# Patient Record
Sex: Female | Born: 1995 | Race: Black or African American | Hispanic: No | Marital: Single | Smoking: Current every day smoker
Health system: Southern US, Community
[De-identification: ages and names within clinical notes are randomized; demographics above are authoritative.]

## PROBLEM LIST (undated history)

## (undated) ENCOUNTER — Ambulatory Visit (HOSPITAL_COMMUNITY): Admission: EM | Payer: BLUE CROSS/BLUE SHIELD | Source: Home / Self Care

## (undated) ENCOUNTER — Ambulatory Visit (HOSPITAL_COMMUNITY): Admission: EM | Payer: Medicaid Other | Source: Home / Self Care

## (undated) DIAGNOSIS — Z8619 Personal history of other infectious and parasitic diseases: Secondary | ICD-10-CM

## (undated) HISTORY — DX: Personal history of other infectious and parasitic diseases: Z86.19

---
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
## 2015-04-14 ENCOUNTER — Emergency Department (HOSPITAL_COMMUNITY)
Admission: EM | Admit: 2015-04-14 | Discharge: 2015-04-14 | Disposition: A | Discharge status: Home / Self Care | Payer: No Typology Code available for payment source | Attending: Emergency Medicine | Admitting: Emergency Medicine

## 2015-04-14 ENCOUNTER — Encounter (HOSPITAL_COMMUNITY): Payer: Self-pay | Admitting: *Deleted

## 2015-04-14 DIAGNOSIS — S79921A Unspecified injury of right thigh, initial encounter: Secondary | ICD-10-CM | POA: Insufficient documentation

## 2015-04-14 DIAGNOSIS — Z79899 Other long term (current) drug therapy: Secondary | ICD-10-CM | POA: Diagnosis not present

## 2015-04-14 DIAGNOSIS — Z7951 Long term (current) use of inhaled steroids: Secondary | ICD-10-CM | POA: Diagnosis not present

## 2015-04-14 DIAGNOSIS — Y9389 Activity, other specified: Secondary | ICD-10-CM | POA: Diagnosis not present

## 2015-04-14 DIAGNOSIS — Y999 Unspecified external cause status: Secondary | ICD-10-CM | POA: Insufficient documentation

## 2015-04-14 DIAGNOSIS — Z8619 Personal history of other infectious and parasitic diseases: Secondary | ICD-10-CM | POA: Diagnosis not present

## 2015-04-14 DIAGNOSIS — S3992XA Unspecified injury of lower back, initial encounter: Secondary | ICD-10-CM | POA: Diagnosis not present

## 2015-04-14 DIAGNOSIS — M79604 Pain in right leg: Secondary | ICD-10-CM

## 2015-04-14 DIAGNOSIS — Z793 Long term (current) use of hormonal contraceptives: Secondary | ICD-10-CM | POA: Insufficient documentation

## 2015-04-14 DIAGNOSIS — Z7952 Long term (current) use of systemic steroids: Secondary | ICD-10-CM | POA: Diagnosis not present

## 2015-04-14 DIAGNOSIS — Z720 Tobacco use: Secondary | ICD-10-CM | POA: Diagnosis not present

## 2015-04-14 DIAGNOSIS — Y92481 Parking lot as the place of occurrence of the external cause: Secondary | ICD-10-CM | POA: Insufficient documentation

## 2015-04-14 DIAGNOSIS — IMO0002 Reserved for concepts with insufficient information to code with codable children: Secondary | ICD-10-CM

## 2015-04-14 DIAGNOSIS — M545 Low back pain, unspecified: Secondary | ICD-10-CM

## 2015-04-14 MED ORDER — IBUPROFEN 600 MG PO TABS: 600.0000 mg | ORAL_TABLET | Freq: Four times a day (QID) | ORAL | Status: DC | PRN
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
## 2015-04-14 NOTE — ED Notes (Signed)
Declined W/C at D/C and was escorted to lobby by RN. 

## 2015-04-14 NOTE — Discharge Instructions (Signed)
Read the information below.  Use the prescribed medication as directed.  Please discuss all new medications with your pharmacist.  You may return to the Emergency Department at any time for worsening condition or any new symptoms that concern you.    If there is any possibility that you might be pregnant, please let your health care provider know and discuss this with the pharmacist to ensure medication safety.     If you develop fevers, loss of control of bowel or bladder, weakness or numbness in your legs, or are unable to walk, return to the ER for a recheck.   Back Pain, Adult Back pain is very common. The pain often gets better over time. The cause of back pain is usually not dangerous. Most people can learn to manage their back pain on their own.  HOME CARE   Stay active. Start with short walks on flat ground if you can. Try to walk farther each day.  Do not sit, drive, or stand in one place for more than 30 minutes. Do not stay in bed.  Do not avoid exercise or work. Activity can help your back heal faster.  Be careful when you bend or lift an object. Bend at your knees, keep the object close to you, and do not twist.  Sleep on a firm mattress. Lie on your side, and bend your knees. If you lie on your back, put a pillow under your knees.  Only take medicines as told by your doctor.  Put ice on the injured area.  Put ice in a plastic bag.  Place a towel between your skin and the bag.  Leave the ice on for 15-20 minutes, 03-04 times a day for the first 2 to 3 days. After that, you can switch between ice and heat packs.  Ask your doctor about back exercises or massage.  Avoid feeling anxious or stressed. Find good ways to deal with stress, such as exercise. GET HELP RIGHT AWAY IF:   Your pain does not go away with rest or medicine.  Your pain does not go away in 1 week.  You have new problems.  You do not feel well.  The pain spreads into your legs.  You cannot control  when you poop (bowel movement) or pee (urinate).  Your arms or legs feel weak or lose feeling (numbness).  You feel sick to your stomach (nauseous) or throw up (vomit).  You have belly (abdominal) pain.  You feel like you may pass out (faint). MAKE SURE YOU:   Understand these instructions.  Will watch your condition.  Will get help right away if you are not doing well or get worse. Document Released: 02/09/2008 Document Revised: 11/15/2011 Document Reviewed: 12/25/2013 [facility] Patient Information 2015 [place], [place]. This information is not intended to replace advice given to you by your health care provider. Make sure you discuss any questions you have with your health care provider.
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
## 2015-12-01 ENCOUNTER — Encounter (HOSPITAL_COMMUNITY): Payer: Self-pay | Admitting: Emergency Medicine

## 2015-12-01 ENCOUNTER — Emergency Department (HOSPITAL_COMMUNITY)
Admission: EM | Admit: 2015-12-01 | Discharge: 2015-12-01 | Disposition: A | Discharge status: Home / Self Care | Payer: Medicaid Other | Attending: Emergency Medicine | Admitting: Emergency Medicine

## 2015-12-01 DIAGNOSIS — F17210 Nicotine dependence, cigarettes, uncomplicated: Secondary | ICD-10-CM | POA: Insufficient documentation

## 2015-12-01 DIAGNOSIS — R51 Headache: Secondary | ICD-10-CM

## 2015-12-01 DIAGNOSIS — J069 Acute upper respiratory infection, unspecified: Secondary | ICD-10-CM | POA: Insufficient documentation

## 2015-12-01 DIAGNOSIS — Z8619 Personal history of other infectious and parasitic diseases: Secondary | ICD-10-CM | POA: Diagnosis not present

## 2015-12-01 DIAGNOSIS — R519 Headache, unspecified: Secondary | ICD-10-CM

## 2015-12-01 DIAGNOSIS — R110 Nausea: Secondary | ICD-10-CM | POA: Insufficient documentation

## 2015-12-01 MED ORDER — IBUPROFEN 600 MG PO TABS: 600.0000 mg | ORAL_TABLET | Freq: Three times a day (TID) | ORAL | Status: DC | PRN

## 2015-12-01 NOTE — ED Provider Notes (Signed)
CSN: [id number]     Arrival date & time 12/01/15  [id number] History   First MD Initiated Contact with Patient 12/01/15 [phone]     Chief Complaint  Patient presents with  . Headache  . Nasal Congestion    HPI Headache and nasal congestion started yesterday.  Headache is more in the temples.  No fevers measured but she has felt hot at times.  Some cough.  NO vomiting or diarrhea.  Some nausea.  No neck pain.   No hx of headaches.  Tried some sinus medications.  They helped her rest but did not relieve the symptoms Pt had to leave work today because of her illness so she came to the ED. Past Medical History  Diagnosis Date  . H/O chlamydia infection    Past Surgical History  Procedure Laterality Date  . Induced abortion  2011   Family History  Problem Relation Age of Onset  . Hypertension Maternal Grandfather   . Anemia Mother    Social History  Substance Use Topics  . Smoking status: Current Every Day Smoker    Types: Cigars  . Smokeless tobacco: Never Used  . Alcohol Use: No   OB History    Gravida Para Term Preterm AB TAB SAB Ectopic Multiple Living   1    1          Review of Systems  All other systems reviewed and are negative.     Allergies  Review of patient's allergies indicates no known allergies.  Home Medications   Prior to Admission medications   Medication Sig Start Date End Date Taking? Authorizing Provider  ibuprofen (ADVIL,MOTRIN) 600 MG tablet Take 1 tablet (600 mg total) by mouth every 8 (eight) hours as needed. 12/01/15   Dorie Rank, MD   BP 132/85 mmHg  Pulse 105  Temp(Src) 99.6 F (37.6 C) (Oral)  Resp 16  Ht 5\' 1"  (1.549 m)  Wt 81.647 kg  BMI 34.03 kg/m2  SpO2 100%  LMP 11/29/2015 Physical Exam  Constitutional: She appears well-developed and well-nourished. No distress.  HENT:  Head: Normocephalic and atraumatic.  Right Ear: Tympanic membrane and external ear normal.  Left Ear: Tympanic membrane and external ear normal.  Mouth/Throat: No  uvula swelling. No oropharyngeal exudate, posterior oropharyngeal edema or posterior oropharyngeal erythema.  Eyes: Conjunctivae are normal. Right eye exhibits no discharge. Left eye exhibits no discharge. No scleral icterus.  Neck: Normal range of motion and full passive range of motion without pain. Neck supple. No tracheal deviation present.  Cardiovascular: Normal rate, regular rhythm and intact distal pulses.   Pulmonary/Chest: Effort normal and breath sounds normal. No stridor. No respiratory distress. She has no wheezes. She has no rales.  Abdominal: Soft. Bowel sounds are normal. She exhibits no distension. There is no tenderness. There is no rebound and no guarding.  Musculoskeletal: She exhibits no edema or tenderness.  Neurological: She is alert. She has normal strength. No cranial nerve deficit (no facial droop, extraocular movements intact, no slurred speech) or sensory deficit. She exhibits normal muscle tone. She displays no seizure activity. Coordination normal.  Skin: Skin is warm and dry. No rash noted.  Psychiatric: She has a normal mood and affect.  Nursing note and vitals reviewed.   ED Course  Procedures (including critical care time)   MDM   Final diagnoses:  URI, acute  Acute nonintractable headache, unspecified headache type    Suspect her symptoms are related to an upper respiratory infection and headache  related to sinus congestion.The patient does not appear to have an otitis media. I discussed supportive treatment. I encouraged followup with the primary care doctor next week if symptoms have not resolved. Warning signs and reasons to return to the emergency room were discussed      Dorie Rank, MD 12/01/15 [phone]

## 2015-12-01 NOTE — Discharge Instructions (Signed)
Sinus Headache A sinus headache occurs when the paranasal sinuses become clogged or swollen. Paranasal sinuses are air pockets within the bones of the face. Sinus headaches can range from mild to severe. CAUSES A sinus headache can result from various conditions that affect the sinuses, such as:  Colds.  Sinus infections.  Allergies. SYMPTOMS The main symptom of this condition is a headache that may feel like pain or pressure in the face, forehead, ears, or upper teeth. People who have a sinus headache often have other symptoms, such as:  Congested or runny nose.  Fever.  Inability to smell. Weather changes can make symptoms worse. DIAGNOSIS This condition may be diagnosed based on:  A physical exam and medical history.  Imaging tests, such as a CT scan and MRI, to check for problems with the sinuses.  A specialist may look into the sinuses with a tool that has a camera (endoscopy). TREATMENT Treatment for this condition depends on the cause.  Sinus pain that is caused by a sinus infection may be treated with antibiotic medicine.  Sinus pain that is caused by allergies may be helped by allergy medicines (antihistamines) and medicated nasal sprays.  Sinus pain that is caused by congestion may be helped by flushing the nose and sinuses with saline solution. HOME CARE INSTRUCTIONS  Take medicines only as directed by your health care provider.  If you were prescribed an antibiotic medicine, finish all of it even if you start to feel better.  If you have congestion, use a nasal spray to help reduce pressure.  If directed, apply a warm, moist washcloth to your face to help relieve pain. SEEK MEDICAL CARE IF:  You have headaches more than one time each week.  You have sensitivity to light or sound.  You have a fever.  You feel sick to your stomach (nauseous) or you throw up (vomit).  Your headaches do not get better with treatment. Many people think that they have a  sinus headache when they actually have migraines or tension headaches. SEEK IMMEDIATE MEDICAL CARE IF:  You have vision problems.  You have sudden, severe pain in your face or head.  You have a seizure.  You are confused.  You have a stiff neck.   This information is not intended to replace advice given to you by your health care provider. Make sure you discuss any questions you have with your health care provider.   Document Released: 09/30/2004 Document Revised: 01/07/2015 Document Reviewed: 08/19/2014 Elsevier Interactive Patient Education 2016 [employer] Inc[employer]  

## 2015-12-01 NOTE — ED Notes (Signed)
C/o headache started yesterday-- was sent home from work. 7/10 pain, also c/o nasal congestion. Took sinus, cold meds OTC that just "put me to sleep".

## 2016-01-17 ENCOUNTER — Ambulatory Visit (HOSPITAL_COMMUNITY)
Admission: EM | Admit: 2016-01-17 | Discharge: 2016-01-17 | Disposition: A | Discharge status: Home / Self Care | Payer: Medicaid Other | Attending: Emergency Medicine | Admitting: Emergency Medicine

## 2016-01-17 ENCOUNTER — Ambulatory Visit (INDEPENDENT_AMBULATORY_CARE_PROVIDER_SITE_OTHER): Payer: Medicaid Other

## 2016-01-17 ENCOUNTER — Encounter (HOSPITAL_COMMUNITY): Payer: Self-pay | Admitting: Emergency Medicine

## 2016-01-17 DIAGNOSIS — R062 Wheezing: Secondary | ICD-10-CM

## 2016-01-17 DIAGNOSIS — F17290 Nicotine dependence, other tobacco product, uncomplicated: Secondary | ICD-10-CM | POA: Insufficient documentation

## 2016-01-17 DIAGNOSIS — J45901 Unspecified asthma with (acute) exacerbation: Secondary | ICD-10-CM | POA: Diagnosis not present

## 2016-01-17 DIAGNOSIS — B9789 Other viral agents as the cause of diseases classified elsewhere: Secondary | ICD-10-CM

## 2016-01-17 DIAGNOSIS — B349 Viral infection, unspecified: Secondary | ICD-10-CM

## 2016-01-17 DIAGNOSIS — T161XXA Foreign body in right ear, initial encounter: Secondary | ICD-10-CM | POA: Diagnosis not present

## 2016-01-17 DIAGNOSIS — J029 Acute pharyngitis, unspecified: Secondary | ICD-10-CM | POA: Diagnosis present

## 2016-01-17 DIAGNOSIS — J988 Other specified respiratory disorders: Secondary | ICD-10-CM

## 2016-01-17 LAB — POCT RAPID STREP A: STREPTOCOCCUS, GROUP A SCREEN (DIRECT): NEGATIVE

## 2016-01-17 LAB — POCT PREGNANCY, URINE: Preg Test, Ur: NEGATIVE

## 2016-01-17 MED ORDER — NEOMYCIN-POLYMYXIN-HC 3.5-10000-1 OT SUSP
4.0000 [drp] | Freq: Three times a day (TID) | OTIC | Status: AC
Start: 2016-01-17 — End: 2016-01-25

## 2016-01-17 MED ORDER — ALBUTEROL SULFATE (2.5 MG/3ML) 0.083% IN NEBU
2.5000 mg | INHALATION_SOLUTION | RESPIRATORY_TRACT | Status: AC
  Administered 2016-01-17: 2.5 mg via RESPIRATORY_TRACT

## 2016-01-17 MED ORDER — ALBUTEROL SULFATE (2.5 MG/3ML) 0.083% IN NEBU
2.5000 mg | INHALATION_SOLUTION | Freq: Once | RESPIRATORY_TRACT | Status: AC
  Administered 2016-01-17: 2.5 mg via RESPIRATORY_TRACT

## 2016-01-17 MED ORDER — IPRATROPIUM-ALBUTEROL 0.5-2.5 (3) MG/3ML IN SOLN
RESPIRATORY_TRACT | Status: AC
  Filled 2016-01-17: qty 3

## 2016-01-17 MED ORDER — ALBUTEROL SULFATE HFA 108 (90 BASE) MCG/ACT IN AERS: 1.0000 | INHALATION_SPRAY | Freq: Four times a day (QID) | RESPIRATORY_TRACT | Status: DC | PRN

## 2016-01-17 MED ORDER — PREDNISONE 20 MG PO TABS: 60.0000 mg | ORAL_TABLET | Freq: Every day | ORAL | Status: DC

## 2016-01-17 NOTE — Discharge Instructions (Signed)
NO MORE SMOKING.  Bronchospasm, Adult A bronchospasm is when the tubes that carry air in and out of your lungs (airways) spasm or tighten. During a bronchospasm it is hard to breathe. This is because the airways get smaller. A bronchospasm can be triggered by:  Allergies. These may be to animals, pollen, food, or mold.  Infection. This is a common cause of bronchospasm.  Exercise.  Irritants. These include pollution, cigarette smoke, strong odors, aerosol sprays, and paint fumes.  Weather changes.  Stress.  Being emotional. HOME CARE   Always have a plan for getting help. Know when to call your doctor and local emergency services (911 in the [place] Know where you can get emergency care.  Only take medicines as told by your doctor.  If you were prescribed an inhaler or nebulizer machine, ask your doctor how to use it correctly. Always use a spacer with your inhaler if you were given one.  Stay calm during an attack. Try to relax and breathe more slowly.  Control your home environment:  Change your heating and air conditioning filter at least once a month.  Limit your use of fireplaces and wood stoves.  Do not  smoke. Do not  allow smoking in your home.  Avoid perfumes and fragrances.  Get rid of pests (such as roaches and mice) and their droppings.  Throw away plants if you see mold on them.  Keep your house clean and dust free.  Replace carpet with wood, tile, or vinyl flooring. Carpet can trap dander and dust.  Use allergy-proof pillows, mattress covers, and box spring covers.  Wash bed sheets and blankets every week in hot water. Dry them in a dryer.  Use blankets that are made of polyester or cotton.  Wash hands frequently. GET HELP IF:  You have muscle aches.  You have chest pain.  The thick spit you spit or cough up (sputum) changes from clear or white to yellow, green, gray, or bloody.  The thick spit you spit or cough up gets thicker.  There are  problems that may be related to the medicine you are given such as:  A rash.  Itching.  Swelling.  Trouble breathing. GET HELP RIGHT AWAY IF:  You feel you cannot breathe or catch your breath.  You cannot stop coughing.  Your treatment is not helping you breathe better.  You have very bad chest pain. MAKE SURE YOU:   Understand these instructions.  Will watch your condition.  Will get help right away if you are not doing well or get worse.   This information is not intended to replace advice given to you by your health care provider. Make sure you discuss any questions you have with your health care provider.   Document Released: 06/20/2009 Document Revised: 09/13/2014 Document Reviewed: 02/13/2013 Elsevier Interactive Patient Education 2016 [employer] Inc[employer] Acute Bronchitis Bronchitis is inflammation of the airways that extend from the windpipe into the lungs (bronchi). The inflammation often causes mucus to develop. This leads to a cough, which is the most common symptom of bronchitis.  In acute bronchitis, the condition usually develops suddenly and goes away over time, usually in a couple weeks. Smoking, allergies, and asthma can make bronchitis worse. Repeated episodes of bronchitis may cause further lung problems.  CAUSES Acute bronchitis is most often caused by the same virus that causes a cold. The virus can spread from person to person (contagious) through coughing, sneezing, and touching contaminated objects. SIGNS AND SYMPTOMS  Cough.   Fever.   Coughing up mucus.   Body aches.   Chest congestion.   Chills.   Shortness of breath.   Sore throat.  DIAGNOSIS  Acute bronchitis is usually diagnosed through a physical exam. Your health care provider will also ask you questions about your medical history. Tests, such as chest X-rays, are sometimes done to rule out other conditions.  TREATMENT  Acute bronchitis usually goes away in a couple weeks.  Oftentimes, no medical treatment is necessary. Medicines are sometimes given for relief of fever or cough. Antibiotic medicines are usually not needed but may be prescribed in certain situations. In some cases, an inhaler may be recommended to help reduce shortness of breath and control the cough. A cool mist vaporizer may also be used to help thin bronchial secretions and make it easier to clear the chest.  HOME CARE INSTRUCTIONS  Get plenty of rest.   Drink enough fluids to keep your urine clear or pale yellow (unless you have a medical condition that requires fluid restriction). Increasing fluids may help thin your respiratory secretions (sputum) and reduce chest congestion, and it will prevent dehydration.   Take medicines only as directed by your health care provider.  If you were prescribed an antibiotic medicine, finish it all even if you start to feel better.  Avoid smoking and secondhand smoke. Exposure to cigarette smoke or irritating chemicals will make bronchitis worse. If you are a smoker, consider using nicotine gum or skin patches to help control withdrawal symptoms. Quitting smoking will help your lungs heal faster.   Reduce the chances of another bout of acute bronchitis by washing your hands frequently, avoiding people with cold symptoms, and trying not to touch your hands to your mouth, nose, or eyes.   Keep all follow-up visits as directed by your health care provider.  SEEK MEDICAL CARE IF: Your symptoms do not improve after 1 week of treatment.  SEEK IMMEDIATE MEDICAL CARE IF:  You develop an increased fever or chills.   You have chest pain.   You have severe shortness of breath.  You have bloody sputum.   You develop dehydration.  You faint or repeatedly feel like you are going to pass out.  You develop repeated vomiting.  You develop a severe headache. MAKE SURE YOU:   Understand these instructions.  Will watch your condition.  Will get help  right away if you are not doing well or get worse.   This information is not intended to replace advice given to you by your health care provider. Make sure you discuss any questions you have with your health care provider.   Document Released: 09/30/2004 Document Revised: 09/13/2014 Document Reviewed: 02/13/2013 Elsevier Interactive Patient Education [employer].

## 2016-01-17 NOTE — ED Notes (Signed)
Pt. Stated, I started having a runny nose and my throat hurting really bad since Wednesday.

## 2016-01-17 NOTE — ED Provider Notes (Signed)
CSN: [id number]     Arrival date & time 01/17/16  1331 History   None    Chief Complaint  Patient presents with  . Sore Throat  . Nasal Congestion   (Consider location/radiation/quality/duration/timing/severity/associated sxs/prior Treatment)  HPI   Patient is a 20 year old female presenting today with complaints of 4 days worth of "not feeling well". Patient states she's had a cough with congestion.  She reports positive nausea but negative vomiting and diarrhea. She reports she has a headache with a fever and chills that has been intermittent in nature. She has not taking anything for this. Denies significant medical history, medications or allergies.  Past Medical History  Diagnosis Date  . H/O chlamydia infection    Past Surgical History  Procedure Laterality Date  . Induced abortion  2011   Family History  Problem Relation Age of Onset  . Hypertension Maternal Grandfather   . Anemia Mother    Social History  Substance Use Topics  . Smoking status: Current Every Day Smoker    Types: Cigars  . Smokeless tobacco: Never Used  . Alcohol Use: No   OB History    Gravida Para Term Preterm AB TAB SAB Ectopic Multiple Living   1    1          Review of Systems  Constitutional: Negative.  Negative for fever and chills.  HENT: Positive for congestion. Negative for sinus pressure, sneezing and sore throat.   Eyes: Negative.   Respiratory: Positive for cough, shortness of breath and wheezing.   Cardiovascular: Negative.   Gastrointestinal: Negative.   Endocrine: Negative.   Genitourinary: Negative.   Musculoskeletal: Positive for myalgias. Negative for neck pain and neck stiffness.  Skin: Negative.  Negative for pallor and rash.  Allergic/Immunologic: Positive for environmental allergies. Negative for food allergies and immunocompromised state.  Neurological: Negative.   Hematological: Negative.   Psychiatric/Behavioral: Negative.     Allergies  Review of patient's  allergies indicates no known allergies.  Home Medications   Prior to Admission medications   Medication Sig Start Date End Date Taking? Authorizing Provider  albuterol (PROVENTIL HFA;VENTOLIN HFA) 108 (90 Base) MCG/ACT inhaler Inhale 1-2 puffs into the lungs every 6 (six) hours as needed for wheezing or shortness of breath. 01/17/16   Nehemiah Settle, NP  ibuprofen (ADVIL,MOTRIN) 600 MG tablet Take 1 tablet (600 mg total) by mouth every 8 (eight) hours as needed. 12/01/15   Dorie Rank, MD  neomycin-polymyxin-hydrocortisone (CORTISPORIN) 3.5-10000-1 otic suspension Place 4 drops into the right ear 3 (three) times daily. 01/17/16 01/25/16  Nehemiah Settle, NP  predniSONE (DELTASONE) 20 MG tablet Take 3 tablets (60 mg total) by mouth daily. 01/17/16   Nehemiah Settle, NP   Meds Ordered and Administered this Visit   Medications  albuterol (PROVENTIL) (2.5 MG/3ML) 0.083% nebulizer solution 2.5 mg (2.5 mg Nebulization Given 01/17/16 1550)  albuterol (PROVENTIL) (2.5 MG/3ML) 0.083% nebulizer solution 2.5 mg (2.5 mg Nebulization Given 01/17/16 1631)    BP 132/93 mmHg  Pulse 85  Temp(Src) 98.4 F (36.9 C) (Oral)  SpO2 97%  LMP 01/03/2016 No data found.   Physical Exam  Constitutional: She is oriented to person, place, and time. She appears well-developed and well-nourished. No distress.  HENT:  Head: Normocephalic and atraumatic.  Right Ear: External ear normal.  Left Ear: External ear normal.  Foreign body noted in R ear.  Dr. Bridgett Larsson consulted and an earring was removed.  Patient denied pain and was unaware of  it's presence.  TM intact, but irritated post removal.   Eyes: Conjunctivae are normal. Pupils are equal, round, and reactive to light. Right eye exhibits no discharge. Left eye exhibits no discharge. No scleral icterus.  Neck: Normal range of motion. Neck supple. No thyromegaly present.  Cardiovascular: Normal rate, regular rhythm, normal heart sounds and intact distal pulses.  Exam  reveals no gallop and no friction rub.   No murmur heard. Pulmonary/Chest: Effort normal. No respiratory distress. She has no rales. She exhibits no tenderness.  Diminished air exchange heard throughout crackles present in right lower lobe, clear to coughing.  Neurological: She is alert and oriented to person, place, and time.  Skin: Skin is warm and dry. No rash noted. She is not diaphoretic.  Nursing note and vitals reviewed.   ED Course  .Foreign Body Removal Date/Time: 01/17/2016 4:57 PM Performed by: Nehemiah Settle Authorized by: Nehemiah Settle Consent: Verbal consent obtained. Consent given by: patient Patient identity confirmed: verbally with patient Body area: ear Patient sedated: no Post-procedure assessment: foreign body removed Patient tolerance: Patient tolerated the procedure well with no immediate complications Comments: Removal performed with alligator forceps by Dr. Maryruth Eve.    (including critical care time)  Labs Review Labs Reviewed  CULTURE, GROUP A STREP [facility])  POCT RAPID STREP A  POCT PREGNANCY, URINE   Results for orders placed or performed during the hospital encounter of 01/17/16  POCT rapid strep A [facility])  Result Value Ref Range   Streptococcus, Group A Screen (Direct) NEGATIVE NEGATIVE  Pregnancy, urine POC  Result Value Ref Range   Preg Test, Ur NEGATIVE NEGATIVE    Imaging Review Dg Chest 2 View  01/17/2016  CLINICAL DATA:  Sick for 4 days.  Nasal congestion.  Sore throat. EXAM: CHEST  2 VIEW COMPARISON:  None. FINDINGS: The heart size and mediastinal contours are within normal limits. Both lungs are clear. The visualized skeletal structures are unremarkable. IMPRESSION: No active cardiopulmonary disease. Electronically Signed   By: Kathreen Devoid   On: 01/17/2016 17:05    Wheezing heard in all lobes following duoneb treatment.  Second HHN of 2.5 albuterol ordered.  Increased air exchange and patient no longer wheezing after  second HHN.  States she feels "much better".   MDM   1. Viral respiratory illness   2. Wheezing   3. Asthmatic bronchitis, unspecified asthma severity, with acute exacerbation    Meds ordered this encounter  Medications  . albuterol (PROVENTIL) (2.5 MG/3ML) 0.083% nebulizer solution 2.5 mg    Sig:   . albuterol (PROVENTIL) (2.5 MG/3ML) 0.083% nebulizer solution 2.5 mg    Sig:   . predniSONE (DELTASONE) 20 MG tablet    Sig: Take 3 tablets (60 mg total) by mouth daily.    Dispense:  15 tablet    Refill:  0  . albuterol (PROVENTIL HFA;VENTOLIN HFA) 108 (90 Base) MCG/ACT inhaler    Sig: Inhale 1-2 puffs into the lungs every 6 (six) hours as needed for wheezing or shortness of breath.    Dispense:  1 Inhaler    Refill:  3  . neomycin-polymyxin-hydrocortisone (CORTISPORIN) 3.5-10000-1 otic suspension    Sig: Place 4 drops into the right ear 3 (three) times daily.    Dispense:  10 mL    Refill:  0    The patient verbalizes understanding and agrees to plan of care.      Nehemiah Settle, NP 01/17/16 1739

## 2016-01-19 LAB — CULTURE, GROUP A STREP (THRC)

## 2017-04-26 IMAGING — DX DG CHEST 2V
2 series · 2 of 2 positions shown · non-contrast
Comparison: None.

CLINICAL DATA: Sick for 4 days.  Nasal congestion.  Sore throat.

EXAM:
CHEST  2 VIEW

[chest pa]
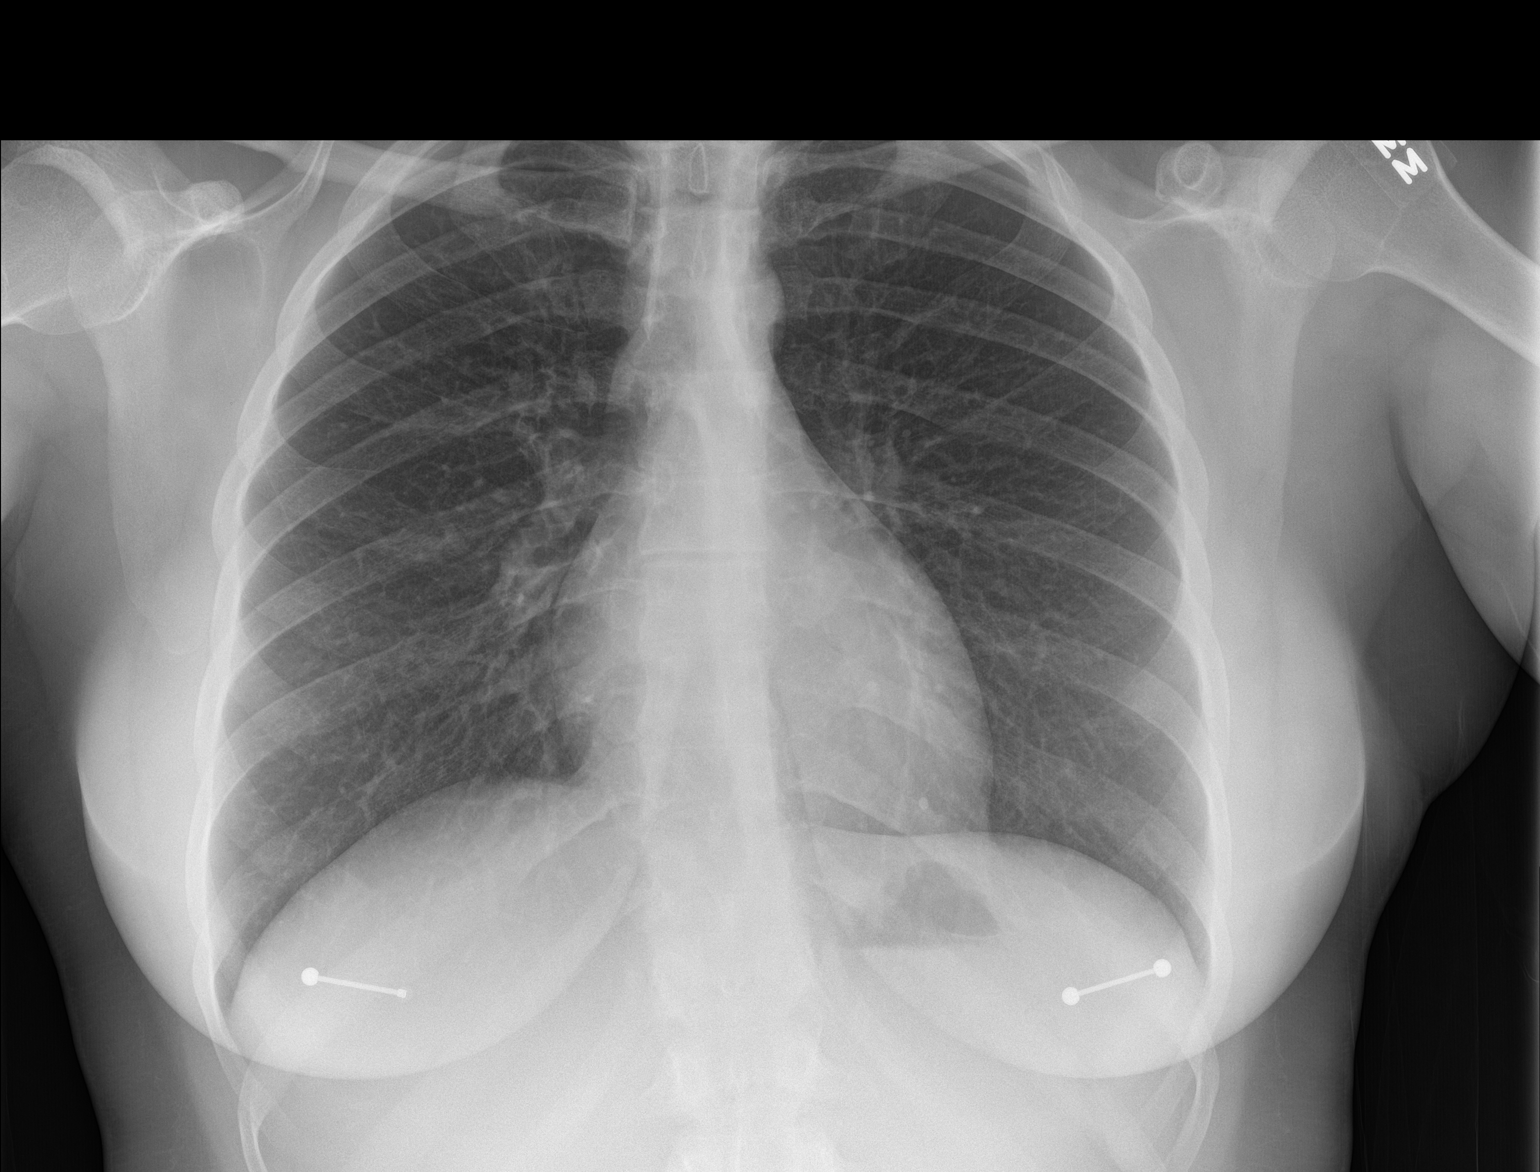

[chest lat]
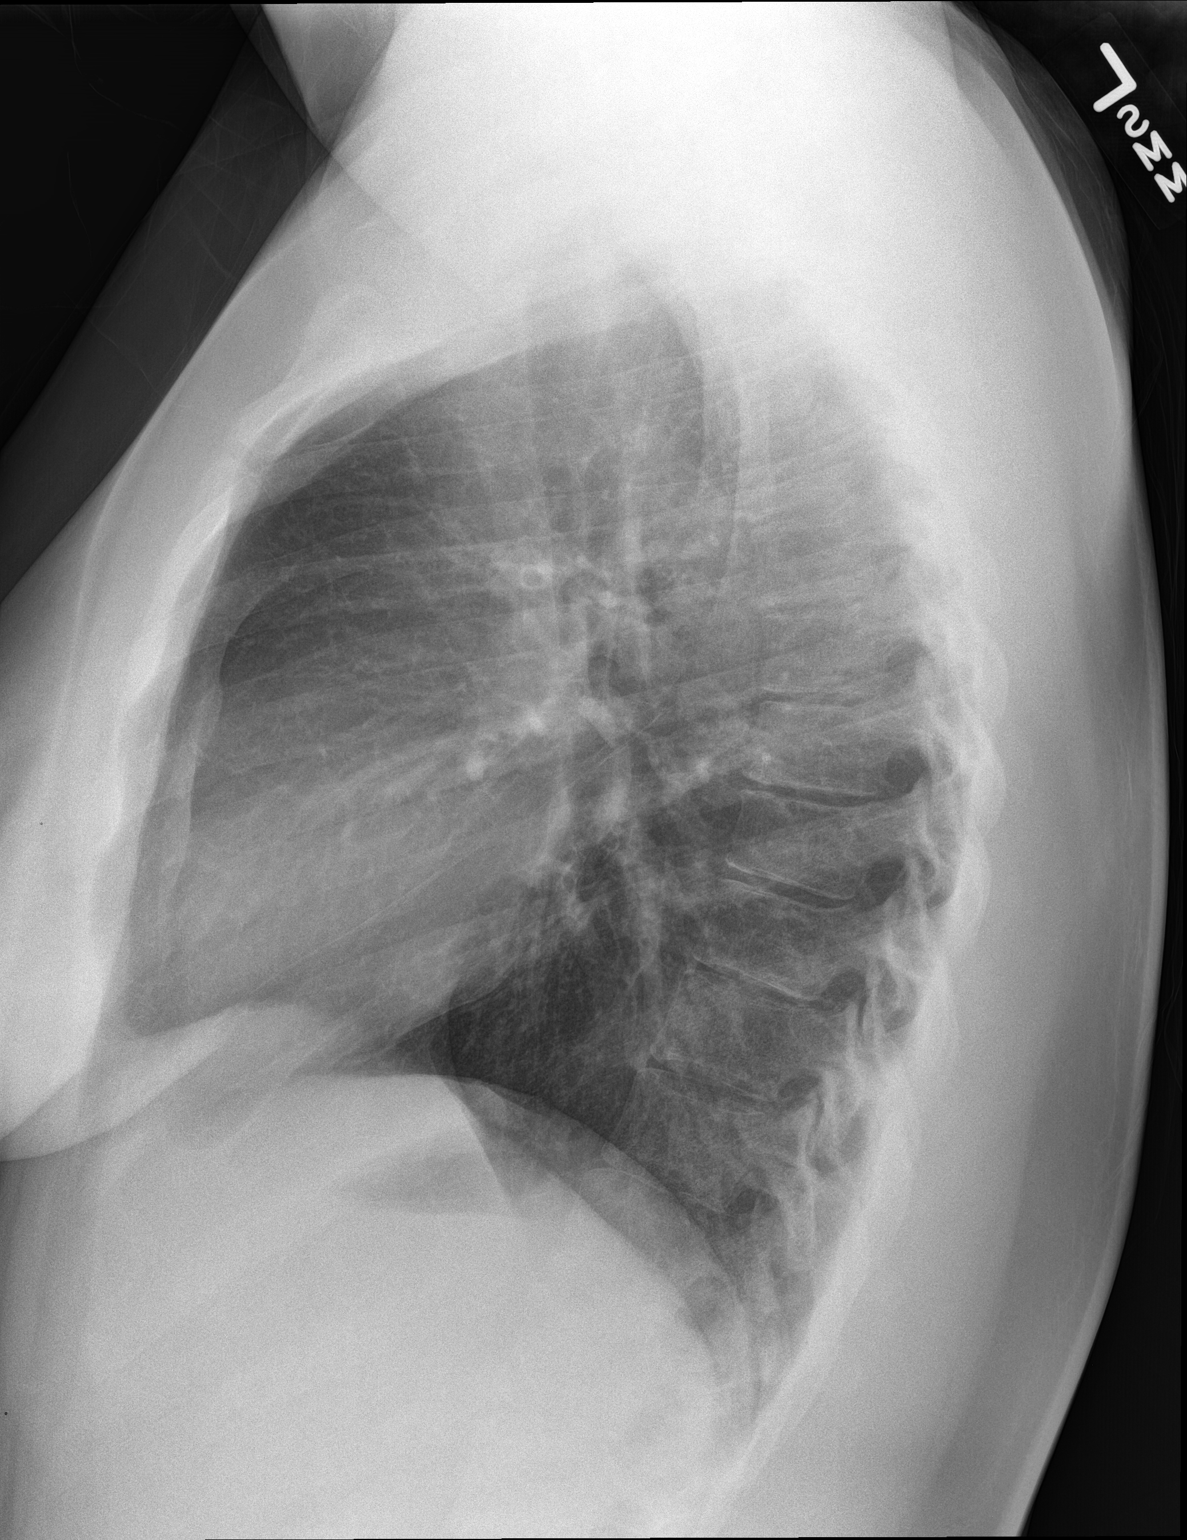

[2 of 2 positions shown; findings below may reference images not displayed]

FINDINGS: The heart size and mediastinal contours are within normal limits.
Both lungs are clear. The visualized skeletal structures are
unremarkable.
IMPRESSION: No active cardiopulmonary disease.

## 2019-06-12 ENCOUNTER — Encounter (HOSPITAL_COMMUNITY): Payer: Self-pay | Admitting: Emergency Medicine

## 2019-06-12 ENCOUNTER — Encounter (HOSPITAL_COMMUNITY): Payer: Self-pay

## 2019-06-12 ENCOUNTER — Emergency Department (HOSPITAL_COMMUNITY)
Admission: EM | Admit: 2019-06-12 | Discharge: 2019-06-12 | Disposition: A | Discharge status: Home / Self Care | Payer: Self-pay | Attending: Emergency Medicine | Admitting: Emergency Medicine

## 2019-06-12 ENCOUNTER — Emergency Department (HOSPITAL_COMMUNITY): Payer: Self-pay

## 2019-06-12 ENCOUNTER — Ambulatory Visit (HOSPITAL_COMMUNITY)
Admission: EM | Admit: 2019-06-12 | Discharge: 2019-06-12 | Disposition: A | Discharge status: Home / Self Care | Payer: Self-pay | Attending: Emergency Medicine | Admitting: Emergency Medicine

## 2019-06-12 DIAGNOSIS — R1030 Lower abdominal pain, unspecified: Secondary | ICD-10-CM | POA: Insufficient documentation

## 2019-06-12 DIAGNOSIS — R197 Diarrhea, unspecified: Secondary | ICD-10-CM

## 2019-06-12 DIAGNOSIS — R300 Dysuria: Secondary | ICD-10-CM

## 2019-06-12 DIAGNOSIS — R52 Pain, unspecified: Secondary | ICD-10-CM

## 2019-06-12 DIAGNOSIS — M549 Dorsalgia, unspecified: Secondary | ICD-10-CM

## 2019-06-12 DIAGNOSIS — R1110 Vomiting, unspecified: Secondary | ICD-10-CM

## 2019-06-12 DIAGNOSIS — Z20828 Contact with and (suspected) exposure to other viral communicable diseases: Secondary | ICD-10-CM | POA: Insufficient documentation

## 2019-06-12 DIAGNOSIS — N946 Dysmenorrhea, unspecified: Secondary | ICD-10-CM | POA: Insufficient documentation

## 2019-06-12 DIAGNOSIS — F17290 Nicotine dependence, other tobacco product, uncomplicated: Secondary | ICD-10-CM | POA: Insufficient documentation

## 2019-06-12 DIAGNOSIS — A599 Trichomoniasis, unspecified: Secondary | ICD-10-CM | POA: Insufficient documentation

## 2019-06-12 LAB — COMPREHENSIVE METABOLIC PANEL
ALT: 23 U/L (ref 0–44)
AST: 24 U/L (ref 15–41)
Albumin: 3.8 g/dL (ref 3.5–5.0)
Alkaline Phosphatase: 67 U/L (ref 38–126)
Anion gap: 9 (ref 5–15)
BUN: 9 mg/dL (ref 6–20)
CO2: 23 mmol/L (ref 22–32)
Calcium: 8.9 mg/dL (ref 8.9–10.3)
Chloride: 106 mmol/L (ref 98–111)
Creatinine, Ser: 0.66 mg/dL (ref 0.44–1.00)
GFR calc Af Amer: >60 mL/min (ref >60)
GFR calc non Af Amer: >60 mL/min (ref >60)
Glucose, Bld: 99 mg/dL (ref 70–99)
Potassium: 4.2 mmol/L (ref 3.5–5.1)
Sodium: 138 mmol/L (ref 135–145)
Total Bilirubin: 0.7 mg/dL (ref 0.3–1.2)
Total Protein: 7.2 g/dL (ref 6.5–8.1)

## 2019-06-12 LAB — URINALYSIS, ROUTINE W REFLEX MICROSCOPIC
Bilirubin Urine: NEGATIVE
Glucose, UA: NEGATIVE mg/dL
Ketones, ur: 20 mg/dL — AB
Nitrite: NEGATIVE
Protein, ur: 30 mg/dL — AB
Specific Gravity, Urine: 1.027 (ref 1.005–1.030)
pH: 5 (ref 5.0–8.0)

## 2019-06-12 LAB — WET PREP, GENITAL
Clue Cells Wet Prep HPF POC: NONE SEEN
Sperm: NONE SEEN
Yeast Wet Prep HPF POC: NONE SEEN

## 2019-06-12 LAB — I-STAT BETA HCG BLOOD, ED (MC, WL, AP ONLY): I-stat hCG, quantitative: <5 m[IU]/mL (ref <5)

## 2019-06-12 LAB — CBC
HCT: 40 % (ref 36.0–46.0)
Hemoglobin: 13.1 g/dL (ref 12.0–15.0)
MCH: 28.7 pg (ref 26.0–34.0)
MCHC: 32.8 g/dL (ref 30.0–36.0)
MCV: 87.7 fL (ref 80.0–100.0)
Platelets: 360 10*3/uL (ref 150–400)
RBC: 4.56 MIL/uL (ref 3.87–5.11)
RDW: 14.4 % (ref 11.5–15.5)
WBC: 9.1 10*3/uL (ref 4.0–10.5)
nRBC: 0 % (ref 0.0–0.2)

## 2019-06-12 LAB — LIPASE, BLOOD: Lipase: 22 U/L (ref 11–51)

## 2019-06-12 MED ORDER — ONDANSETRON 4 MG PO TBDP
4.0000 mg | ORAL_TABLET | Freq: Once | ORAL | Status: AC
  Administered 2019-06-12: 11:00:00 4 mg via ORAL

## 2019-06-12 MED ORDER — METRONIDAZOLE 500 MG PO TABS
2000.0000 mg | ORAL_TABLET | Freq: Once | ORAL | Status: AC
  Administered 2019-06-12: 16:00:00 2000 mg via ORAL
  Filled 2019-06-12: qty 4

## 2019-06-12 MED ORDER — ONDANSETRON 4 MG PO TBDP
ORAL_TABLET | ORAL | Status: AC
  Filled 2019-06-12: qty 1

## 2019-06-12 MED ORDER — ONDANSETRON HCL 4 MG PO TABS: 4.0000 mg | ORAL_TABLET | Freq: Four times a day (QID) | ORAL | 0 refills | Status: DC

## 2019-06-12 MED ORDER — ONDANSETRON HCL 4 MG/2ML IJ SOLN
4.0000 mg | Freq: Once | INTRAMUSCULAR | Status: AC
  Administered 2019-06-12: 12:00:00 4 mg via INTRAVENOUS
  Filled 2019-06-12: qty 2

## 2019-06-12 MED ORDER — CYCLOBENZAPRINE HCL 10 MG PO TABS: 10.0000 mg | ORAL_TABLET | Freq: Two times a day (BID) | ORAL | 0 refills | Status: AC | PRN

## 2019-06-12 MED ORDER — ONDANSETRON 4 MG PO TBDP: 4.0000 mg | ORAL_TABLET | Freq: Once | ORAL | Status: DC

## 2019-06-12 MED ORDER — SODIUM CHLORIDE 0.9% FLUSH: 3.0000 mL | Freq: Once | INTRAVENOUS | Status: DC

## 2019-06-12 MED ORDER — NAPROXEN 500 MG PO TABS: 500.0000 mg | ORAL_TABLET | Freq: Two times a day (BID) | ORAL | 0 refills | Status: DC

## 2019-06-12 MED ORDER — KETOROLAC TROMETHAMINE 15 MG/ML IJ SOLN
15.0000 mg | Freq: Once | INTRAMUSCULAR | Status: AC
  Administered 2019-06-12: 12:00:00 15 mg via INTRAVENOUS
  Filled 2019-06-12: qty 1

## 2019-06-12 MED ORDER — SODIUM CHLORIDE 0.9 % IV BOLUS (SEPSIS)
1000.0000 mL | Freq: Once | INTRAVENOUS | Status: AC
  Administered 2019-06-12: 12:00:00 1000 mL via INTRAVENOUS

## 2019-06-12 NOTE — ED Provider Notes (Signed)
[place]    CSN: [id number] Arrival date & time: 06/12/19  1004      History   Chief Complaint Chief Complaint  Patient presents with  . Emesis  . Back Pain  . Abdominal Pain    HPI Ashley Mclaughlin is a 23 y.o. female.   Patient presents with 2-day history of lower abdominal pain, back pain, dysuria, diarrhea, and vomiting.  She states she had one episode of vomiting/diarrhea yesterday and three episdoes today.  She denies known sick contacts.  Denies fever, chills, cough, shortness of breath.  LMP: started today.    The history is provided by the patient.    Past Medical History:  Diagnosis Date  . H/O chlamydia infection     There are no active problems to display for this patient.   Past Surgical History:  Procedure Laterality Date  . INDUCED ABORTION  2011    OB History    Gravida  1   Para      Term      Preterm      AB  1   Living        SAB      TAB      Ectopic      Multiple      Live Births               Home Medications    Prior to Admission medications   Medication Sig Start Date End Date Taking? Authorizing Provider  albuterol (PROVENTIL HFA;VENTOLIN HFA) 108 (90 Base) MCG/ACT inhaler Inhale 1-2 puffs into the lungs every 6 (six) hours as needed for wheezing or shortness of breath. 01/17/16   Nehemiah Settle, NP  ibuprofen (ADVIL,MOTRIN) 600 MG tablet Take 1 tablet (600 mg total) by mouth every 8 (eight) hours as needed. 12/01/15   Dorie Rank, MD  predniSONE (DELTASONE) 20 MG tablet Take 3 tablets (60 mg total) by mouth daily. 01/17/16   Nehemiah Settle, NP  fluticasone (FLONASE) 50 MCG/ACT nasal spray Place 2 sprays into both nostrils at bedtime. Patient not taking: Reported on 12/01/2015 12/06/14 12/01/15  Waldemar Dickens, MD  ipratropium (ATROVENT) 0.06 % nasal spray Place 2 sprays into both nostrils 4 (four) times daily. Patient not taking: Reported on 12/01/2015 12/06/14 12/01/15  Waldemar Dickens, MD     Family History Family History  Problem Relation Age of Onset  . Hypertension Maternal Grandfather   . Anemia Mother     Social History Social History   Tobacco Use  . Smoking status: Current Every Day Smoker    Types: Cigars  . Smokeless tobacco: Never Used  Substance Use Topics  . Alcohol use: No  . Drug use: No     Allergies   Patient has no known allergies.   Review of Systems Review of Systems  Constitutional: Negative for chills and fever.  HENT: Negative for ear pain and sore throat.   Eyes: Negative for pain and visual disturbance.  Respiratory: Negative for cough and shortness of breath.   Cardiovascular: Negative for chest pain and palpitations.  Gastrointestinal: Positive for abdominal pain, diarrhea and vomiting.  Genitourinary: Positive for dysuria. Negative for hematuria, pelvic pain and vaginal discharge.  Musculoskeletal: Positive for back pain. Negative for arthralgias.  Skin: Negative for color change and rash.  Neurological: Negative for seizures and syncope.  All other systems reviewed and are negative.    Physical Exam Triage Vital Signs ED Triage Vitals  Enc Vitals  Group     BP      Pulse      Resp      Temp      Temp src      SpO2      Weight      Height      Head Circumference      Peak Flow      Pain Score      Pain Loc      Pain Edu?      Excl. in [place]?    No data found.  Updated Vital Signs BP 128/84 (BP Location: Right Arm)   Pulse 74   Temp 98.2 F (36.8 C) (Temporal)   Resp 16   SpO2 99%   Visual Acuity Right Eye Distance:   Left Eye Distance:   Bilateral Distance:    Right Eye Near:   Left Eye Near:    Bilateral Near:     Physical Exam Vitals signs and nursing note reviewed.  Constitutional:      General: She is not in acute distress.    Appearance: She is well-developed.  HENT:     Head: Normocephalic and atraumatic.     Mouth/Throat:     Mouth: Mucous membranes are moist.     Pharynx: Oropharynx is  clear.  Eyes:     Conjunctiva/sclera: Conjunctivae normal.  Neck:     Musculoskeletal: Neck supple.  Cardiovascular:     Rate and Rhythm: Normal rate and regular rhythm.     Heart sounds: No murmur.  Pulmonary:     Effort: Pulmonary effort is normal. No respiratory distress.     Breath sounds: Normal breath sounds.  Abdominal:     General: Bowel sounds are normal.     Palpations: Abdomen is soft.     Tenderness: There is no abdominal tenderness. There is no right CVA tenderness, left CVA tenderness, guarding or rebound.  Skin:    General: Skin is warm and dry.     Findings: No rash.  Neurological:     General: No focal deficit present.     Mental Status: She is alert and oriented to person, place, and time.      UC Treatments / Results  Labs (all labs ordered are listed, but only abnormal results are displayed) Labs Reviewed  NOVEL CORONAVIRUS, NAA (HOSP ORDER, SEND-OUT TO REF LAB; TAT 18-24 HRS)  URINE CULTURE    EKG   Radiology No results found.  Procedures Procedures (including critical care time)  Medications Ordered in UC Medications  ondansetron (ZOFRAN-ODT) disintegrating tablet 4 mg (4 mg Oral Given 06/12/19 1046)  ondansetron (ZOFRAN-ODT) 4 MG disintegrating tablet (has no administration in time range)    Initial Impression / Assessment and Plan / UC Course  I have reviewed the triage vital signs and the nursing notes.  Pertinent labs & imaging results that were available during my care of the patient were reviewed by me and considered in my medical decision making (see chart for details).   Abdominal pain.  Patient is ambulatory and her exam was unremarkable.  Given Zofran and oral fluids here; no emesis after drinking fluid.  However, she is tearful and states her abdominal pain is 10/10.  Discussed with patient that she will need to go to the emergency department for further evaluation if her abdominal pain is this severe as she may need a higher level  of care.  Patient agrees to plan of care.  Final Clinical Impressions(s) / UC Diagnoses   Final diagnoses:  Lower abdominal pain     Discharge Instructions     Go to the emergency department for evaluation of your abdominal pain.    ED Prescriptions    None     PDMP not reviewed this encounter.   Sharion Balloon, NP 06/12/19 1057

## 2019-06-12 NOTE — Discharge Instructions (Addendum)
Go to the emergency department for evaluation of your abdominal pain.  

## 2019-06-12 NOTE — ED Provider Notes (Signed)
[place] EMERGENCY DEPARTMENT Provider Note   CSN: [id number] Arrival date & time: 06/12/19  1104     History   Chief Complaint Chief Complaint  Patient presents with   Abdominal Pain    HPI Ashley Mclaughlin is a 23 y.o. female.     23 y/o F with no PMH presenting to the ED for 2 days of pelvic pain. Reports that she "always" gets this pain before her menstrual cycle but this time it feels worse.  She reports 2 days of nonbloody vomiting and diarrhea.  Reports the pain is in her lower pelvic region.  Reports that she is on her menstrual cycle starting yesterday and she is having the normal amount of bleeding.  No other vaginal discharge.  Has not tried anything for relief.     Past Medical History:  Diagnosis Date   H/O chlamydia infection     There are no active problems to display for this patient.   Past Surgical History:  Procedure Laterality Date   INDUCED ABORTION  2011     OB History    Gravida  1   Para      Term      Preterm      AB  1   Living        SAB      TAB      Ectopic      Multiple      Live Births               Home Medications    Prior to Admission medications   Medication Sig Start Date End Date Taking? Authorizing Provider  ibuprofen (ADVIL,MOTRIN) 600 MG tablet Take 1 tablet (600 mg total) by mouth every 8 (eight) hours as needed. 12/01/15  Yes Dorie Rank, MD  albuterol (PROVENTIL HFA;VENTOLIN HFA) 108 (90 Base) MCG/ACT inhaler Inhale 1-2 puffs into the lungs every 6 (six) hours as needed for wheezing or shortness of breath. Patient not taking: Reported on 06/12/2019 01/17/16   Nehemiah Settle, NP  cyclobenzaprine (FLEXERIL) 10 MG tablet Take 1 tablet (10 mg total) by mouth 2 (two) times daily as needed for up to 7 days for muscle spasms. 06/12/19 06/19/19  Madilyn Hook A, PA-C  naproxen (NAPROSYN) 500 MG tablet Take 1 tablet (500 mg total) by mouth 2 (two) times daily. 06/12/19   Madilyn Hook  A, PA-C  ondansetron (ZOFRAN) 4 MG tablet Take 1 tablet (4 mg total) by mouth every 6 (six) hours. 06/12/19   Alveria Apley, PA-C  predniSONE (DELTASONE) 20 MG tablet Take 3 tablets (60 mg total) by mouth daily. Patient not taking: Reported on 06/12/2019 01/17/16   Nehemiah Settle, NP  fluticasone [facility]) 50 MCG/ACT nasal spray Place 2 sprays into both nostrils at bedtime. Patient not taking: Reported on 12/01/2015 12/06/14 12/01/15  Waldemar Dickens, MD  ipratropium (ATROVENT) 0.06 % nasal spray Place 2 sprays into both nostrils 4 (four) times daily. Patient not taking: Reported on 12/01/2015 12/06/14 12/01/15  Waldemar Dickens, MD    Family History Family History  Problem Relation Age of Onset   Hypertension Maternal Grandfather    Anemia Mother     Social History Social History   Tobacco Use   Smoking status: Current Every Day Smoker    Types: Cigars   Smokeless tobacco: Never Used  Substance Use Topics   Alcohol use: No   Drug use: No     Allergies  Patient has no known allergies.   Review of Systems Review of Systems  Constitutional: Negative for activity change, appetite change, chills and fever.  HENT: Negative for congestion.   Respiratory: Negative for cough and shortness of breath.   Gastrointestinal: Positive for abdominal pain, diarrhea, nausea and vomiting. Negative for anal bleeding, blood in stool and constipation.  Genitourinary: Positive for pelvic pain and vaginal bleeding. Negative for decreased urine volume, difficulty urinating, dysuria, flank pain, genital sores, hematuria, vaginal discharge and vaginal pain.  Musculoskeletal: Negative for gait problem.  Skin: Negative for rash.  Neurological: Negative for dizziness and light-headedness.     Physical Exam Updated Vital Signs BP 120/80    Pulse 63    Temp 97.9 F (36.6 C) (Oral)    Resp 18    SpO2 99%   Physical Exam Vitals signs and nursing note reviewed.  Constitutional:      General:  She is not in acute distress.    Appearance: Normal appearance. She is well-developed. She is not ill-appearing, toxic-appearing or diaphoretic.  HENT:     Head: Normocephalic.     Mouth/Throat:     Mouth: Mucous membranes are moist.  Eyes:     Conjunctiva/sclera: Conjunctivae normal.  Cardiovascular:     Rate and Rhythm: Normal rate and regular rhythm.  Pulmonary:     Effort: Pulmonary effort is normal.  Abdominal:     General: Abdomen is flat. Bowel sounds are normal. There is no distension.     Palpations: Abdomen is soft.     Tenderness: There is abdominal tenderness in the suprapubic area. There is no right CVA tenderness, left CVA tenderness, guarding or rebound. Negative signs include Murphy's sign and Rovsing's sign.  Skin:    General: Skin is dry.  Neurological:     Mental Status: She is alert.  Psychiatric:        Mood and Affect: Mood normal.      ED Treatments / Results  Labs (all labs ordered are listed, but only abnormal results are displayed) Labs Reviewed  WET PREP, GENITAL - Abnormal; Notable for the following components:      Result Value   Trich, Wet Prep PRESENT (*)    WBC, Wet Prep HPF POC MANY (*)    All other components within normal limits  LIPASE, BLOOD  COMPREHENSIVE METABOLIC PANEL  CBC  URINALYSIS, ROUTINE W REFLEX MICROSCOPIC  I-STAT BETA HCG BLOOD, ED ([facility], [facility], [facility] ONLY)  GC/CHLAMYDIA PROBE AMP ([place]) NOT AT [facility]    EKG None  Radiology US Pelvic Complete W Transvaginal And Torsion R/o  Result Date: 06/12/2019 CLINICAL DATA:  Right lower quadrant pain 2 days. EXAM: TRANSABDOMINAL AND TRANSVAGINAL ULTRASOUND OF PELVIS TECHNIQUE: Both transabdominal and transvaginal ultrasound examinations of the pelvis were performed. Transabdominal technique was performed for global imaging of the pelvis including uterus, ovaries, adnexal regions, and pelvic cul-de-sac. It was necessary to proceed with endovaginal exam following the transabdominal  exam to visualize the endometrium and ovaries. COMPARISON:  None FINDINGS: Uterus Measurements: 7.3 x 3.4 x 4.4 cm = volume: 5 mL. No fibroids or other mass visualized. Endometrium Thickness: 4.2 mm.  No focal abnormality visualized. Right ovary Measurements: 3.5 x 2.0 x 4.8 cm = volume: 18 mL. Normal appearance/no adnexal mass. 2.1 cm dominant follicle. Left ovary Measurements: 3.1 x 2.3 x 1.5 cm = volume: 5.5 mL. Normal appearance/no adnexal mass. Other findings No abnormal free fluid. IMPRESSION: Normal pelvic ultrasound. Electronically Signed   By: Quillian Quince  Derrel Nip M.D.   On: 06/12/2019 14:47    Procedures Procedures (including critical care time)  Medications Ordered in ED Medications  sodium chloride flush (NS) 0.9 % injection 3 mL (has no administration in time range)  metroNIDAZOLE (FLAGYL) tablet 2,000 mg (has no administration in time range)  sodium chloride 0.9 % bolus 1,000 mL (0 mLs Intravenous Stopped 06/12/19 1538)  ketorolac (TORADOL) 15 MG/ML injection 15 mg (15 mg Intravenous Given 06/12/19 1229)  ondansetron (ZOFRAN) injection 4 mg (4 mg Intravenous Given 06/12/19 1228)     Initial Impression / Assessment and Plan / ED Course  I have reviewed the triage vital signs and the nursing notes.  Pertinent labs & imaging results that were available during my care of the patient were reviewed by me and considered in my medical decision making (see chart for details).  Clinical Course as of Jun 11 1537  Tue Jun 12, 2019  1441 Patient with pelvic cramping, n/v x2 days. Hx of the same related to menstrual cycles. CMP, CBC normal. Improved with toradol and zofran. Pelvic ultrasound normal. No CMT on exam. +trich. Treated with flagyl 2 grams. Patient and her partner in the room to discuss safe sex and making sure they are both treated and re-tested prior to sexual intercourse again. Patient would like to wait on any gonorrhea/chlamydia treatment since she states that the treatment in the past  made her vomit and she is not currently having any vaginal discharge.    [KM]    Clinical Course User Index [KM] Alveria Apley, PA-C       Based on review of vitals, medical screening exam, lab work and/or imaging, there does not appear to be an acute, emergent etiology for the patient's symptoms. Counseled pt on good return precautions and encouraged both PCP and ED follow-up as needed.  Prior to discharge, I also discussed incidental imaging findings with patient in detail and advised appropriate, recommended follow-up in detail.  Clinical Impression: 1. Trichimoniasis   2. Pain   3. Painful menstrual periods     Disposition: Discharge  Prior to providing a prescription for a controlled substance, I independently reviewed the patient's recent prescription history on the [place]. The patient had no recent or regular prescriptions and was deemed appropriate for a brief, less than 3 day prescription of narcotic for acute analgesia.  This note was prepared with assistance of Systems analyst. Occasional wrong-word or sound-a-like substitutions may have occurred due to the inherent limitations of voice recognition software.   Final Clinical Impressions(s) / ED Diagnoses   Final diagnoses:  Painful menstrual periods  Trichimoniasis    ED Discharge Orders         Ordered    ondansetron (ZOFRAN) 4 MG tablet  Every 6 hours     06/12/19 1514    naproxen (NAPROSYN) 500 MG tablet  2 times daily     06/12/19 1514    cyclobenzaprine (FLEXERIL) 10 MG tablet  2 times daily PRN     06/12/19 1514           Kristine Royal 06/12/19 1538    Sherwood Gambler, MD 06/13/19 [phone]

## 2019-06-12 NOTE — ED Triage Notes (Signed)
Pt report having back pain, abdominal pain and vomiting episodes x 2 days.

## 2019-06-12 NOTE — ED Triage Notes (Signed)
Pt reports lower abd pain for 2 days with n/v/d - 3 episodes of emesis in 24 hours & 2 episodes of diarrhea. Pt also began her menstrual period today.

## 2019-06-12 NOTE — ED Notes (Signed)
Patient is being discharged from the Urgent [place] and sent to the Emergency Department via personal vehicle by family member. Per Provider Barkley Boards, patient is stable but in need of higher level of care due to severe abdominal pain. Patient is aware and verbalizes understanding of plan of care.    Vitals:   06/12/19 1025  BP: 128/84  Pulse: 74  Resp: 16  Temp: 98.2 F (36.8 C)  SpO2: 99%

## 2019-06-12 NOTE — Discharge Instructions (Addendum)
NO SEXUAL ACTIVITY until both you and your partner have been treated and have been retested to make sure you are negative for any STD. The health department is available for retesting and also to test you for HIV, hepatitis and other sexually transmitted infections. We will call you with your gonorrhea and chlamydia results if they are positive.   Thank you for allowing me to care for you today. Please return to the emergency department if you have new or worsening symptoms. Take your medications as instructed.

## 2019-06-13 LAB — NOVEL CORONAVIRUS, NAA (HOSP ORDER, SEND-OUT TO REF LAB; TAT 18-24 HRS): SARS-CoV-2, NAA: NOT DETECTED

## 2019-06-13 LAB — URINE CULTURE: Culture: NO GROWTH

## 2019-06-13 LAB — GC/CHLAMYDIA PROBE AMP (~~LOC~~) NOT AT ARMC
Chlamydia: NEGATIVE
Neisseria Gonorrhea: NEGATIVE

## 2021-03-03 ENCOUNTER — Encounter (HOSPITAL_COMMUNITY): Payer: Self-pay | Admitting: Emergency Medicine

## 2021-03-03 ENCOUNTER — Other Ambulatory Visit: Payer: Self-pay

## 2021-03-03 ENCOUNTER — Emergency Department (HOSPITAL_COMMUNITY)
Admission: EM | Admit: 2021-03-03 | Discharge: 2021-03-03 | Disposition: A | Discharge status: Home / Self Care | Payer: Medicaid Other | Attending: Emergency Medicine | Admitting: Emergency Medicine

## 2021-03-03 DIAGNOSIS — X501XXA Overexertion from prolonged static or awkward postures, initial encounter: Secondary | ICD-10-CM | POA: Insufficient documentation

## 2021-03-03 DIAGNOSIS — F17290 Nicotine dependence, other tobacco product, uncomplicated: Secondary | ICD-10-CM | POA: Insufficient documentation

## 2021-03-03 DIAGNOSIS — S46811A Strain of other muscles, fascia and tendons at shoulder and upper arm level, right arm, initial encounter: Secondary | ICD-10-CM

## 2021-03-03 DIAGNOSIS — M542 Cervicalgia: Secondary | ICD-10-CM | POA: Insufficient documentation

## 2021-03-03 MED ORDER — NAPROXEN 500 MG PO TABS: 500.0000 mg | ORAL_TABLET | Freq: Two times a day (BID) | ORAL | 0 refills | Status: DC

## 2021-03-03 MED ORDER — NAPROXEN 250 MG PO TABS
500.0000 mg | ORAL_TABLET | Freq: Once | ORAL | Status: AC
  Administered 2021-03-03: 500 mg via ORAL
  Filled 2021-03-03: qty 2

## 2021-03-03 MED ORDER — METHOCARBAMOL 500 MG PO TABS: 500.0000 mg | ORAL_TABLET | Freq: Two times a day (BID) | ORAL | 0 refills | Status: DC

## 2021-03-03 MED ORDER — METHOCARBAMOL 500 MG PO TABS
500.0000 mg | ORAL_TABLET | Freq: Once | ORAL | Status: AC
  Administered 2021-03-03: 500 mg via ORAL
  Filled 2021-03-03: qty 1

## 2021-03-03 NOTE — Discharge Instructions (Addendum)
1. Medications: robaxin, naproxyn, usual home medications 2. Treatment: rest, drink plenty of fluids, gentle stretching as discussed, alternate ice and heat 3. Follow Up: Please followup with your primary doctor  and or orthopedics in 5-7 days for discussion of your diagnoses and further evaluation after today's visit; if you do not have a primary care doctor use the resource guide provided to find one;  Return to the ER for worsening back pain, difficulty walking, loss of bowel or bladder control or other concerning symptoms

## 2021-03-03 NOTE — ED Provider Notes (Signed)
[facility] EMERGENCY DEPARTMENT Provider Note   CSN: [id number] Arrival date & time: 03/03/21  0358     History Chief Complaint  Patient presents with   Extremity Weakness    Ashley Mclaughlin is a 25 y.o. female presents to the Emergency Department complaining of gradual, persistent, progressively worsening right-sided neck and shoulder pain onset yesterday.  Patient reports she noticed the pain after reaching above her head to get down a bottle of syrup.  She did not have sudden, sharp or severe neck pain at that time.  She did not hear a pop.  Reports she did not think much about it but as the evening wore on she developed more neck and shoulder pain.  No treatments prior to arrival.  Movement and palpation make her symptoms worse.  Attempting to lie on her right side also makes her symptoms worse.  Nothing seems to make them better.  Denies previous symptoms like this.  Denies falls or known trauma.  Denies IV drug use, fever, chills.   The history is provided by the patient and medical records. No language interpreter was used.      Past Medical History:  Diagnosis Date   H/O chlamydia infection     There are no problems to display for this patient.   Past Surgical History:  Procedure Laterality Date   INDUCED ABORTION  2011     OB History     Gravida  1   Para      Term      Preterm      AB  1   Living         SAB      IAB      Ectopic      Multiple      Live Births              Family History  Problem Relation Age of Onset   Hypertension Maternal Grandfather    Anemia Mother     Social History   Tobacco Use   Smoking status: Every Day    Pack years: 0.00    Types: Cigars   Smokeless tobacco: Never  Substance Use Topics   Alcohol use: No   Drug use: No    Home Medications Prior to Admission medications   Medication Sig Start Date End Date Taking? Authorizing Provider  methocarbamol (ROBAXIN) 500 MG tablet  Take 1 tablet (500 mg total) by mouth 2 (two) times daily. 03/03/21  Yes Kaidence Sant, Jarrett Soho, PA-C  naproxen (NAPROSYN) 500 MG tablet Take 1 tablet (500 mg total) by mouth 2 (two) times daily with a meal. 03/03/21  Yes Kaisy Severino, Jarrett Soho, PA-C  albuterol (PROVENTIL HFA;VENTOLIN HFA) 108 (90 Base) MCG/ACT inhaler Inhale 1-2 puffs into the lungs every 6 (six) hours as needed for wheezing or shortness of breath. Patient not taking: Reported on 06/12/2019 01/17/16   Nehemiah Settle, NP  ondansetron (ZOFRAN) 4 MG tablet Take 1 tablet (4 mg total) by mouth every 6 (six) hours. 06/12/19   Madilyn Hook A, PA-C  fluticasone (FLONASE) 50 MCG/ACT nasal spray Place 2 sprays into both nostrils at bedtime. Patient not taking: Reported on 12/01/2015 12/06/14 12/01/15  Waldemar Dickens, MD  ipratropium (ATROVENT) 0.06 % nasal spray Place 2 sprays into both nostrils 4 (four) times daily. Patient not taking: Reported on 12/01/2015 12/06/14 12/01/15  Waldemar Dickens, MD    Allergies    Patient has no known allergies.  Review of Systems  Review of Systems  Constitutional:  Negative for fever.  Cardiovascular:  Negative for chest pain.  Gastrointestinal:  Negative for abdominal pain.  Musculoskeletal:  Positive for arthralgias, myalgias and neck pain. Negative for joint swelling and neck stiffness.  Neurological:  Negative for headaches.   Physical Exam Updated Vital Signs BP 128/86 (BP Location: Left Arm)   Pulse 93   Temp 98.4 F (36.9 C)   Resp 17   SpO2 100%   Physical Exam Vitals and nursing note reviewed.  Constitutional:      General: She is not in acute distress.    Appearance: She is well-developed. She is not ill-appearing.  HENT:     Head: Normocephalic.  Eyes:     General: No scleral icterus.    Conjunctiva/sclera: Conjunctivae normal.  Cardiovascular:     Rate and Rhythm: Normal rate.  Pulmonary:     Effort: Pulmonary effort is normal.  Abdominal:     General: There is no  distension.  Musculoskeletal:     Right shoulder: Tenderness (posterior) present. Decreased range of motion (slightly).     Left shoulder: Normal.     Right upper arm: Normal.     Left upper arm: Normal.     Right elbow: Normal.     Left elbow: Normal.     Cervical back: Normal range of motion. No bony tenderness. Pain with movement present. Normal range of motion.     Thoracic back: Normal.       Back:  Skin:    General: Skin is warm and dry.  Neurological:     Mental Status: She is alert.  Psychiatric:        Mood and Affect: Mood normal.    ED Results / Procedures / Treatments   Labs (all labs ordered are listed, but only abnormal results are displayed) Labs Reviewed - No data to display  EKG None  Radiology No results found.  Procedures Procedures   Medications Ordered in ED Medications  methocarbamol (ROBAXIN) tablet 500 mg (has no administration in time range)  naproxen (NAPROSYN) tablet 500 mg (has no administration in time range)    ED Course  I have reviewed the triage vital signs and the nursing notes.  Pertinent labs & imaging results that were available during my care of the patient were reviewed by me and considered in my medical decision making (see chart for details).    MDM Rules/Calculators/A&P                          Patient presents with right-sided shoulder pain.  On exam she has right-sided paraspinal tenderness with almost full range of motion of her cervical spine without pain.  Palpable trapezius muscle spasm.  Pain is nontraumatic - no indication for imaging at this time. Will give anti-inflammatories and Robaxin.  No headache, vision changes or radiation of pain.  Less likely cervical radiculopathy or vertebral dissection at this time.  No previous trauma.  Discussed home therapies, gentle stretching and reasons to return to the emergency department.  Patient states understanding and is in agreement with the plan.   Final Clinical  Impression(s) / ED Diagnoses Final diagnoses:  Neck pain  Trapezius strain, right, initial encounter    Rx / DC Orders ED Discharge Orders          Ordered    naproxen (NAPROSYN) 500 MG tablet  2 times daily with meals  03/03/21 0444    methocarbamol (ROBAXIN) 500 MG tablet  2 times daily        03/03/21 0444             Xianna Siverling, Jarrett Soho, PA-C 03/03/21 0446    Veryl Speak, MD 03/03/21 [phone]

## 2021-03-03 NOTE — ED Triage Notes (Signed)
Yesterday she notice when she was lifting right arm, she noticed some weakness.  She is also having some muscle spasms at the right shoulder.  She states that the pain has not gone away.

## 2021-04-07 ENCOUNTER — Emergency Department (HOSPITAL_COMMUNITY)
Admission: EM | Admit: 2021-04-07 | Discharge: 2021-04-07 | Disposition: A | Discharge status: Home / Self Care | Payer: Medicaid Other | Attending: Emergency Medicine | Admitting: Emergency Medicine

## 2021-04-07 ENCOUNTER — Other Ambulatory Visit: Payer: Self-pay

## 2021-04-07 ENCOUNTER — Emergency Department (HOSPITAL_COMMUNITY): Payer: Medicaid Other

## 2021-04-07 ENCOUNTER — Encounter (HOSPITAL_COMMUNITY): Payer: Self-pay | Admitting: Emergency Medicine

## 2021-04-07 DIAGNOSIS — M542 Cervicalgia: Secondary | ICD-10-CM | POA: Insufficient documentation

## 2021-04-07 DIAGNOSIS — S43401A Unspecified sprain of right shoulder joint, initial encounter: Secondary | ICD-10-CM | POA: Insufficient documentation

## 2021-04-07 DIAGNOSIS — R000 Tachycardia, unspecified: Secondary | ICD-10-CM | POA: Insufficient documentation

## 2021-04-07 DIAGNOSIS — S40011A Contusion of right shoulder, initial encounter: Secondary | ICD-10-CM | POA: Insufficient documentation

## 2021-04-07 DIAGNOSIS — F17290 Nicotine dependence, other tobacco product, uncomplicated: Secondary | ICD-10-CM | POA: Insufficient documentation
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
## 2021-04-07 NOTE — Discharge Instructions (Addendum)
You can take the anti-inflammatories and muscle relaxers that you already have at home.  Try heating pad as well.  Avoid any heavy lifting with that side.  The next 2 days may hurt a little bit worse but then it should start getting better.

## 2021-04-07 NOTE — ED Notes (Signed)
PT d/c by this RN for another RN. PT brought to [address] for re-eval and ok to d/c per MD

## 2022-06-27 ENCOUNTER — Ambulatory Visit (HOSPITAL_COMMUNITY)
Admission: EM | Admit: 2022-06-27 | Discharge: 2022-06-27 | Disposition: A | Discharge status: Home / Self Care | Payer: Medicaid Other | Attending: Internal Medicine | Admitting: Internal Medicine

## 2022-06-27 DIAGNOSIS — N6325 Unspecified lump in the left breast, overlapping quadrants: Secondary | ICD-10-CM

## 2022-06-27 NOTE — ED Triage Notes (Signed)
Pt noticed a "bump" to her right breast several years ago. Pt reports the bump is back.  Pt reports her right breast is painful to touch. Pt does not have a pcp at this time.

## 2022-06-27 NOTE — ED Provider Notes (Signed)
[facility]    CSN: [id number] Arrival date & time: 06/27/22  1035      History   Chief Complaint Chief Complaint  Patient presents with   Breast Mass    HPI Ashley Mclaughlin is a 26 y.o. female.   Patient presents urgent care for evaluation of right-sided breast lump that she first noticed 6 years ago.  She got the breast lump evaluated by [facility] imaging at the breast center where she was told that the breast lump was benign.  The lump began to grow significantly in size and tenderness over the last couple of months.  Patient finished her menstrual cycle 2 days ago and states that the lump does not change in size based on where she is on her menstrual cycle.  No nipple drainage, color change to the nipple or breast, axillary tenderness, axillary lymph node swelling, or fever/chills.  No night sweats or recent weight loss without trying.  Denies personal and family history of breast cancer.  She has not attempted use of any over-the-counter medications prior to arrival urgent care for her symptoms.  The lump is tender to palpation and she states that the lump feels like it is the size of a "golf ball" to the right breast.  She does not use estrogen containing birth control.     Past Medical History:  Diagnosis Date   H/O chlamydia infection     There are no problems to display for this patient.   Past Surgical History:  Procedure Laterality Date   INDUCED ABORTION  2011    OB History     Gravida  1   Para      Term      Preterm      AB  1   Living         SAB      IAB      Ectopic      Multiple      Live Births               Home Medications    Prior to Admission medications   Medication Sig Start Date End Date Taking? Authorizing Provider  albuterol (PROVENTIL HFA;VENTOLIN HFA) 108 (90 Base) MCG/ACT inhaler Inhale 1-2 puffs into the lungs every 6 (six) hours as needed for wheezing or shortness of breath. Patient not taking:  Reported on 06/12/2019 01/17/16   Nehemiah Settle, NP  methocarbamol (ROBAXIN) 500 MG tablet Take 1 tablet (500 mg total) by mouth 2 (two) times daily. 03/03/21   Muthersbaugh, Jarrett Soho, PA-C  naproxen (NAPROSYN) 500 MG tablet Take 1 tablet (500 mg total) by mouth 2 (two) times daily with a meal. 03/03/21   Muthersbaugh, Jarrett Soho, PA-C  ondansetron (ZOFRAN) 4 MG tablet Take 1 tablet (4 mg total) by mouth every 6 (six) hours. 06/12/19   Madilyn Hook A, PA-C  fluticasone (FLONASE) 50 MCG/ACT nasal spray Place 2 sprays into both nostrils at bedtime. Patient not taking: Reported on 12/01/2015 12/06/14 12/01/15  Waldemar Dickens, MD  ipratropium (ATROVENT) 0.06 % nasal spray Place 2 sprays into both nostrils 4 (four) times daily. Patient not taking: Reported on 12/01/2015 12/06/14 12/01/15  Waldemar Dickens, MD    Family History Family History  Problem Relation Age of Onset   Hypertension Maternal Grandfather    Anemia Mother     Social History Social History   Tobacco Use   Smoking status: Every Day    Types: Cigars   Smokeless  tobacco: Never  Substance Use Topics   Alcohol use: No   Drug use: No     Allergies   Patient has no known allergies.   Review of Systems Review of Systems Per HPI  Physical Exam Triage Vital Signs ED Triage Vitals [06/27/22 1210]  Enc Vitals Group     BP 130/85     Pulse Rate 68     Resp 16     Temp 97.7 F (36.5 C)     Temp Source Oral     SpO2 100 %     Weight      Height      Head Circumference      Peak Flow      Pain Score      Pain Loc      Pain Edu?      Excl. in [place]?    No data found.  Updated Vital Signs BP 130/85 (BP Location: Left Arm)   Pulse 68   Temp 97.7 F (36.5 C) (Oral)   Resp 16   SpO2 100%   Visual Acuity Right Eye Distance:   Left Eye Distance:   Bilateral Distance:    Right Eye Near:   Left Eye Near:    Bilateral Near:     Physical Exam Exam conducted with a chaperone present [place], CMA).  Chest:  Breasts:     Right: Mass and tenderness present. No swelling, bleeding, inverted nipple, nipple discharge or skin change.     Left: Mass and tenderness present. No swelling, bleeding, inverted nipple, nipple discharge or skin change.       Comments: Golf ball sized movable and tender mass present to the right breast at area indicated above.  Pea-sized movable and tender mass to the left breast at area indicated above.   Lymphadenopathy:     Upper Body:     Right upper body: Axillary adenopathy present.      UC Treatments / Results  Labs (all labs ordered are listed, but only abnormal results are displayed) Labs Reviewed - No data to display  EKG   Radiology No results found.  Procedures Procedures (including critical care time)  Medications Ordered in UC Medications - No data to display  Initial Impression / Assessment and Plan / UC Course  I have reviewed the triage vital signs and the nursing notes.  Pertinent labs & imaging results that were available during my care of the patient were reviewed by me and considered in my medical decision making (see chart for details).   1.  Breast lump on the left side at the 12 o'clock position Exam reveals  movable and tender breast lumps.  Bilateral diagnostic mammogram ordered for stat imaging for further evaluation.  Patient may use ibuprofen and warm compresses to the area of greatest tenderness over the next few days while waiting for mammogram appointment.  Follow-up with PCP prior to mammogram appointment for new or worsening symptoms or return to urgent care.  ED precautions given for any new or worsening symptoms such as nipple discharge or skin color change to the breast.  Discussed physical exam and available lab work findings in clinic with patient.  Counseled patient regarding appropriate use of medications and potential side effects for all medications recommended or prescribed today. Discussed red flag signs and symptoms of worsening  condition,when to call the PCP office, return to urgent care, and when to seek higher level of care in the emergency department. Patient verbalizes understanding and  agreement with plan. All questions answered. Patient discharged in stable condition.    Final Clinical Impressions(s) / UC Diagnoses   Final diagnoses:  Breast lump on left side at 12 o'clock position     Discharge Instructions      I have ordered a mammogram to both breasts for further evaluation of breast lump.  You may apply heat to the area of greatest tenderness and take ibuprofen as needed to help with pain.  The breast [facility] will be calling you in the next few days to schedule this mammogram appointment.  If you do not hear from them in the next few days, give them a call to schedule the appointment.  Return to urgent care as needed.  Hope you feel better!     ED Prescriptions   None    PDMP not reviewed this encounter.   Talbot Grumbling, [place] 06/27/22 1326

## 2022-06-27 NOTE — Discharge Instructions (Addendum)
I have ordered a mammogram to both breasts for further evaluation of breast lump.  You may apply heat to the area of greatest tenderness and take ibuprofen as needed to help with pain.  The breast [facility] will be calling you in the next few days to schedule this mammogram appointment.  If you do not hear from them in the next few days, give them a call to schedule the appointment.  Return to urgent care as needed.  Hope you feel better!

## 2022-06-28 ENCOUNTER — Other Ambulatory Visit (HOSPITAL_COMMUNITY): Payer: Self-pay | Admitting: Internal Medicine

## 2022-06-28 DIAGNOSIS — N6310 Unspecified lump in the right breast, unspecified quadrant: Secondary | ICD-10-CM

## 2022-06-28 DIAGNOSIS — N644 Mastodynia: Secondary | ICD-10-CM

## 2022-08-19 ENCOUNTER — Ambulatory Visit: Payer: Medicaid Other

## 2022-08-19 ENCOUNTER — Telehealth: Payer: Self-pay

## 2022-08-19 NOTE — Telephone Encounter (Signed)
Patient telephoned [facility]. Patient ineligible for [facility] program after answering screening questions.

## 2022-09-08 ENCOUNTER — Ambulatory Visit (HOSPITAL_COMMUNITY)
Admission: RE | Admit: 2022-09-08 | Discharge: 2022-09-08 | Disposition: A | Discharge status: Home / Self Care | Payer: BLUE CROSS/BLUE SHIELD | Source: Ambulatory Visit | Attending: Emergency Medicine | Admitting: Emergency Medicine

## 2022-09-08 ENCOUNTER — Encounter (HOSPITAL_COMMUNITY): Payer: Self-pay

## 2022-09-08 VITALS — BP 109/67 | HR 77 | Temp 98.0°F | Resp 15

## 2022-09-08 DIAGNOSIS — Z113 Encounter for screening for infections with a predominantly sexual mode of transmission: Secondary | ICD-10-CM | POA: Diagnosis present

## 2022-09-08 DIAGNOSIS — Z202 Contact with and (suspected) exposure to infections with a predominantly sexual mode of transmission: Secondary | ICD-10-CM | POA: Insufficient documentation

## 2022-09-08 LAB — RPR: RPR Ser Ql: NONREACTIVE

## 2022-09-08 LAB — HIV ANTIBODY (ROUTINE TESTING W REFLEX): HIV Screen 4th Generation wRfx: NONREACTIVE
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
## 2022-09-08 NOTE — ED Provider Notes (Signed)
[facility]    CSN: [id number] Arrival date & time: 09/08/22  1018      History   Chief Complaint Chief Complaint  Patient presents with   appt 1030   Exposure to STD    HPI Laloni Rowton is a 27 y.o. female.  Presents with possible STD exposure Reports partner tested positive for chlamydia He had "warts" on his penis Patient denies any symptoms currently, including vaginal discharge, irritation, itching, rash or lesions  LMP is now  Past Medical History:  Diagnosis Date   H/O chlamydia infection     There are no problems to display for this patient.   Past Surgical History:  Procedure Laterality Date   INDUCED ABORTION  2011    OB History     Gravida  1   Para      Term      Preterm      AB  1   Living         SAB      IAB      Ectopic      Multiple      Live Births               Home Medications    Prior to Admission medications   Medication Sig Start Date End Date Taking? Authorizing Provider  albuterol (PROVENTIL HFA;VENTOLIN HFA) 108 (90 Base) MCG/ACT inhaler Inhale 1-2 puffs into the lungs every 6 (six) hours as needed for wheezing or shortness of breath. Patient not taking: Reported on 06/12/2019 01/17/16   Nehemiah Settle, NP  fluticasone [facility]) 50 MCG/ACT nasal spray Place 2 sprays into both nostrils at bedtime. Patient not taking: Reported on 12/01/2015 12/06/14 12/01/15  Waldemar Dickens, MD  ipratropium (ATROVENT) 0.06 % nasal spray Place 2 sprays into both nostrils 4 (four) times daily. Patient not taking: Reported on 12/01/2015 12/06/14 12/01/15  Waldemar Dickens, MD    Family History Family History  Problem Relation Age of Onset   Hypertension Maternal Grandfather    Anemia Mother     Social History Social History   Tobacco Use   Smoking status: Every Day    Types: Cigars   Smokeless tobacco: Never  Substance Use Topics   Alcohol use: No   Drug use: No     Allergies   Patient has no known  allergies.   Review of Systems Review of Systems As per HPI  Physical Exam Triage Vital Signs ED Triage Vitals  Enc Vitals Group     BP 09/08/22 1042 109/67     Pulse Rate 09/08/22 1042 77     Resp 09/08/22 1042 15     Temp 09/08/22 1042 98 F (36.7 C)     Temp Source 09/08/22 1042 Oral     SpO2 09/08/22 1042 100 %     Weight --      Height --      Head Circumference --      Peak Flow --      Pain Score 09/08/22 1041 0     Pain Loc --      Pain Edu? --      Excl. in [place]? --    No data found.  Updated Vital Signs BP 109/67 (BP Location: Left Arm)   Pulse 77   Temp 98 F (36.7 C) (Oral)   Resp 15   LMP 09/06/2022   SpO2 100%    Physical Exam Vitals and nursing note reviewed.  Constitutional:      General: She is not in acute distress.    Appearance: Normal appearance.  HENT:     Mouth/Throat:     Pharynx: Oropharynx is clear.  Cardiovascular:     Rate and Rhythm: Normal rate and regular rhythm.     Pulses: Normal pulses.  Pulmonary:     Effort: Pulmonary effort is normal.  Neurological:     Mental Status: She is alert and oriented to person, place, and time.     UC Treatments / Results  Labs (all labs ordered are listed, but only abnormal results are displayed) Labs Reviewed  HIV ANTIBODY (ROUTINE TESTING W REFLEX)  RPR  CERVICOVAGINAL ANCILLARY ONLY    EKG  Radiology No results found.  Procedures Procedures   Medications Ordered in UC Medications - No data to display  Initial Impression / Assessment and Plan / UC Course  I have reviewed the triage vital signs and the nursing notes.  Pertinent labs & imaging results that were available during my care of the patient were reviewed by me and considered in my medical decision making (see chart for details).  Cyto swab and HIV/RPR pending. Recommend blood work as patient hasn't had done. Discussed treating positive result as indicated. Recommend return for repeat testing in 3-4 weeks if  swab is negative, or if she develops symptoms. Return precautions discussed. Patient agrees to plan  Final Clinical Impressions(s) / UC Diagnoses   Final diagnoses:  Possible exposure to STD  Screen for STD (sexually transmitted disease)     Discharge Instructions      We will call you if anything on your swab or blood work returns positive. Please abstain from sexual intercourse until your results return. If indicated we can send medication to your pharmacy.      ED Prescriptions   None    PDMP not reviewed this encounter.   Tahjae Clausing, [place], [place] 09/08/22 1121

## 2022-09-08 NOTE — Discharge Instructions (Signed)
We will call you if anything on your swab or blood work returns positive. Please abstain from sexual intercourse until your results return. If indicated we can send medication to your pharmacy.

## 2022-09-09 LAB — CERVICOVAGINAL ANCILLARY ONLY
Bacterial Vaginitis (gardnerella): POSITIVE — AB
Candida Glabrata: NEGATIVE
Candida Vaginitis: POSITIVE — AB
Chlamydia: NEGATIVE
Comment: NEGATIVE
Comment: NEGATIVE
Comment: NEGATIVE
Comment: NEGATIVE
Comment: NEGATIVE
Comment: NORMAL
Neisseria Gonorrhea: NEGATIVE
Trichomonas: POSITIVE — AB

## 2022-09-10 ENCOUNTER — Ambulatory Visit (HOSPITAL_COMMUNITY)
Admission: EM | Admit: 2022-09-10 | Discharge: 2022-09-10 | Disposition: A | Discharge status: Home / Self Care | Payer: BLUE CROSS/BLUE SHIELD

## 2022-09-10 ENCOUNTER — Telehealth (HOSPITAL_COMMUNITY): Payer: Self-pay | Admitting: Emergency Medicine

## 2022-09-10 MED ORDER — FLUCONAZOLE 150 MG PO TABS: 150.0000 mg | ORAL_TABLET | Freq: Once | ORAL | 0 refills | Status: AC

## 2022-09-10 MED ORDER — METRONIDAZOLE 500 MG PO TABS: 500.0000 mg | ORAL_TABLET | Freq: Two times a day (BID) | ORAL | 0 refills | Status: DC

## 2022-09-10 NOTE — ED Notes (Signed)
During triage, Ashley Mclaughlin our call-back nurse came in to discuss test results with patient. Patient does not need to be medically evaluated or triaged today. Call back nurse discussing plan of care with patient currently and will remove her off our system

## 2022-09-10 NOTE — ED Notes (Signed)
Reviewed results and treatment with patient.  She expressed some concerns about her partners symptoms and test results.  This RN encouraged her to have her partner follow up where he was tested to review results and medications and then confirm if there is still confusion.  She states both of their phones have been off, so they can't be contacted for changes.    Verified pharmacy, prescription sent for Metronidazole and DIflucan.

## 2022-12-01 ENCOUNTER — Encounter (HOSPITAL_COMMUNITY): Payer: Self-pay | Admitting: Emergency Medicine

## 2022-12-01 ENCOUNTER — Other Ambulatory Visit: Payer: Self-pay

## 2022-12-01 ENCOUNTER — Emergency Department (HOSPITAL_COMMUNITY)
Admission: EM | Admit: 2022-12-01 | Discharge: 2022-12-01 | Disposition: A | Discharge status: Home / Self Care | Payer: BLUE CROSS/BLUE SHIELD | Attending: Emergency Medicine | Admitting: Emergency Medicine

## 2022-12-01 DIAGNOSIS — B9689 Other specified bacterial agents as the cause of diseases classified elsewhere: Secondary | ICD-10-CM | POA: Diagnosis not present

## 2022-12-01 DIAGNOSIS — L292 Pruritus vulvae: Secondary | ICD-10-CM | POA: Diagnosis present

## 2022-12-01 DIAGNOSIS — N760 Acute vaginitis: Secondary | ICD-10-CM | POA: Insufficient documentation

## 2022-12-01 LAB — GC/CHLAMYDIA PROBE AMP (~~LOC~~) NOT AT ARMC
Chlamydia: NEGATIVE
Comment: NEGATIVE
Comment: NORMAL
Neisseria Gonorrhea: NEGATIVE

## 2022-12-01 LAB — WET PREP, GENITAL
Sperm: NONE SEEN
Trich, Wet Prep: NONE SEEN
WBC, Wet Prep HPF POC: <10 (ref <10)
Yeast Wet Prep HPF POC: NONE SEEN

## 2022-12-01 LAB — URINALYSIS, ROUTINE W REFLEX MICROSCOPIC
Bilirubin Urine: NEGATIVE
Glucose, UA: NEGATIVE mg/dL
Ketones, ur: NEGATIVE mg/dL
Leukocytes,Ua: NEGATIVE
Nitrite: NEGATIVE
Protein, ur: NEGATIVE mg/dL
Specific Gravity, Urine: 1.028 (ref 1.005–1.030)
pH: 5 (ref 5.0–8.0)

## 2022-12-01 LAB — PREGNANCY, URINE: Preg Test, Ur: NEGATIVE

## 2022-12-01 MED ORDER — METRONIDAZOLE 500 MG PO TABS: 500.0000 mg | ORAL_TABLET | Freq: Two times a day (BID) | ORAL | 0 refills | Status: DC

## 2022-12-01 NOTE — ED Triage Notes (Signed)
Patient reports irritation around her vaginal area x 2 days.  Patient also reports a "little bit" of discharge.  GCS 15

## 2022-12-01 NOTE — ED Provider Notes (Signed)
[facility] Provider Note   CSN: [id number] Arrival date & time: 12/01/22  0246     History  Chief Complaint  Patient presents with   Vaginal Itching    Ashley Mclaughlin is a 27 y.o. female.  The history is provided by the patient and medical records.  Vaginal Itching  Ashley Mclaughlin is a 27 y.o. female who presents to the Emergency Department complaining of vaginal itching and irritation that started 2 days ago with associated vaginal discharge.  She had a similar episode a few months ago and was treated with antibiotics for both BV and trichomonas.  No associated abdominal pain, fever, diarrhea.  She does have occasional nausea and dysuria.  She has no known medical problems and takes no routine medications.  She is sexually active with a single partner.  Her partner was evaluated at the same time she was treated for trichomonas but at that time her partner tested positive for chlamydia.      Home Medications Prior to Admission medications   Medication Sig Start Date End Date Taking? Authorizing Provider  metroNIDAZOLE (FLAGYL) 500 MG tablet Take 1 tablet (500 mg total) by mouth 2 (two) times daily. 12/01/22  Yes Quintella Reichert, MD  albuterol (PROVENTIL HFA;VENTOLIN HFA) 108 (90 Base) MCG/ACT inhaler Inhale 1-2 puffs into the lungs every 6 (six) hours as needed for wheezing or shortness of breath. Patient not taking: Reported on 06/12/2019 01/17/16   Nehemiah Settle, NP  fluticasone [facility]) 50 MCG/ACT nasal spray Place 2 sprays into both nostrils at bedtime. Patient not taking: Reported on 12/01/2015 12/06/14 12/01/15  Waldemar Dickens, MD  ipratropium (ATROVENT) 0.06 % nasal spray Place 2 sprays into both nostrils 4 (four) times daily. Patient not taking: Reported on 12/01/2015 12/06/14 12/01/15  Waldemar Dickens, MD      Allergies    Patient has no known allergies.    Review of Systems   Review of Systems  All other systems  reviewed and are negative.   Physical Exam Updated Vital Signs BP 111/67 (BP Location: Right Arm)   Pulse 86   Temp 97.9 F (36.6 C) (Oral)   Resp 18   Ht 5\' 1"  (1.549 m)   Wt 93.9 kg   LMP 11/21/2022 (Approximate)   SpO2 100%   BMI 39.11 kg/m  Physical Exam Vitals and nursing note reviewed.  Constitutional:      Appearance: She is well-developed.  HENT:     Head: Normocephalic and atraumatic.  Cardiovascular:     Rate and Rhythm: Normal rate and regular rhythm.  Pulmonary:     Effort: Pulmonary effort is normal. No respiratory distress.  Abdominal:     Palpations: Abdomen is soft.     Tenderness: There is no abdominal tenderness. There is no guarding or rebound.  Genitourinary:    Comments: Scant white vaginal discharge with no CMT.  No external lesions Musculoskeletal:        General: No tenderness.  Skin:    General: Skin is warm and dry.  Neurological:     Mental Status: She is alert and oriented to person, place, and time.  Psychiatric:        Behavior: Behavior normal.     ED Results / Procedures / Treatments   Labs (all labs ordered are listed, but only abnormal results are displayed) Labs Reviewed  WET PREP, GENITAL - Abnormal; Notable for the following components:      Result Value  Clue Cells Wet Prep HPF POC PRESENT (*)    All other components within normal limits  URINALYSIS, ROUTINE W REFLEX MICROSCOPIC - Abnormal; Notable for the following components:   APPearance HAZY (*)    Hgb urine dipstick SMALL (*)    Bacteria, UA RARE (*)    All other components within normal limits  PREGNANCY, URINE  GC/CHLAMYDIA PROBE AMP ([place]) NOT AT [facility]    EKG None  Radiology No results found.  Procedures Procedures    Medications Ordered in ED Medications - No data to display  ED Course/ Medical Decision Making/ A&P                             Medical Decision Making Risk Prescription drug management.   Patient here for evaluation of  vaginal discharge, she does have small amount of discharge on examination with no evidence of PID, cervicitis or tubo-ovarian abscess.  Her wet prep is positive for BV-will treat given her symptoms.  UA is not consistent with UTI.  Discussed with patient home care for BV with outpatient follow-up and return precautions.        Final Clinical Impression(s) / ED Diagnoses Final diagnoses:  BV (bacterial vaginosis)    Rx / DC Orders ED Discharge Orders          Ordered    metroNIDAZOLE (FLAGYL) 500 MG tablet  2 times daily        12/01/22 0445              Quintella Reichert, MD 12/01/22 [id number]

## 2022-12-19 ENCOUNTER — Ambulatory Visit: Payer: Self-pay

## 2022-12-19 ENCOUNTER — Ambulatory Visit
Admission: EM | Admit: 2022-12-19 | Discharge: 2022-12-19 | Disposition: A | Discharge status: Home / Self Care | Payer: BLUE CROSS/BLUE SHIELD | Attending: Urgent Care | Admitting: Urgent Care

## 2022-12-19 DIAGNOSIS — Z202 Contact with and (suspected) exposure to infections with a predominantly sexual mode of transmission: Secondary | ICD-10-CM

## 2022-12-19 NOTE — ED Provider Notes (Signed)
[facility]  Note:  This document was prepared using Conservation officer, historic buildings and may include unintentional dictation errors.  MRN: [id number] DOB: 10-05-1995  Subjective:   Ashley Mclaughlin is a 27 y.o. female presenting for possible exposure to herpes infection. Patient is engaged to another patient Mosie Lukes) who is being seen today for the same testing.  Per the patient, he has been cheating on her for months.  His sex partner send her a message stating that she tested positive for herpes simplex virus culture for genital herpes.  As such, patient would like to get a blood test done.  Denies ever having a painful blisterlike rash of the mouth, genitals.  No cold sores.  She just had STI testing done and is only wanting to have blood work done for HSV today.  No current facility-administered medications for this encounter.  Current Outpatient Medications:    albuterol (PROVENTIL HFA;VENTOLIN HFA) 108 (90 Base) MCG/ACT inhaler, Inhale 1-2 puffs into the lungs every 6 (six) hours as needed for wheezing or shortness of breath. (Patient not taking: Reported on 06/12/2019), Disp: 1 Inhaler, Rfl: 3   metroNIDAZOLE (FLAGYL) 500 MG tablet, Take 1 tablet (500 mg total) by mouth 2 (two) times daily., Disp: 14 tablet, Rfl: 0   No Known Allergies  Past Medical History:  Diagnosis Date   H/O chlamydia infection      Past Surgical History:  Procedure Laterality Date   INDUCED ABORTION  2011    Family History  Problem Relation Age of Onset   Hypertension Maternal Grandfather    Anemia Mother     Social History   Tobacco Use   Smoking status: Every Day    Types: Cigars   Smokeless tobacco: Never  Vaping Use   Vaping Use: Never used  Substance Use Topics   Alcohol use: No   Drug use: No    ROS   Objective:   Vitals: BP 124/88 (BP Location: Left Arm)   Pulse 98   Temp 99.5 F (37.5 C) (Oral)   Resp 20   LMP 11/21/2022 (Approximate)    SpO2 97%   Physical Exam Constitutional:      General: She is not in acute distress.    Appearance: Normal appearance. She is well-developed. She is not ill-appearing, toxic-appearing or diaphoretic.  HENT:     Head: Normocephalic and atraumatic.     Nose: Nose normal.     Mouth/Throat:     Mouth: Mucous membranes are moist.  Eyes:     General: No scleral icterus.       Right eye: No discharge.        Left eye: No discharge.     Extraocular Movements: Extraocular movements intact.  Cardiovascular:     Rate and Rhythm: Normal rate.  Pulmonary:     Effort: Pulmonary effort is normal.  Skin:    General: Skin is warm and dry.  Neurological:     General: No focal deficit present.     Mental Status: She is alert and oriented to person, place, and time.  Psychiatric:        Mood and Affect: Mood normal.        Behavior: Behavior normal.     Assessment and Plan :   PDMP not reviewed this encounter.  1. Possible exposure to STD    Patient does not have a rash to perform a herpes culture only.  I explained that this is the more  definitive way to confirm herpes simplex virus infection.  Patient verbalized understanding.  The blood test results would not be specific but I was agreeable given her possible exposure.  Labs pending.   Wallis Bamberg, [place] 12/19/22 1432

## 2022-12-19 NOTE — ED Triage Notes (Signed)
Pt requesting herpes test due to exposure-denies known vaginal sore-states she does not need other STD testing-NAD-steady gait

## 2022-12-21 LAB — HSV 1 ANTIBODY, IGG: HSV 1 Glycoprotein G Ab, IgG: >62.2 index — ABNORMAL HIGH (ref 0.00–0.90)

## 2022-12-21 LAB — HSV-2 AB, IGG: HSV 2 IgG, Type Spec: <0.91 index (ref 0.00–0.90)

## 2023-05-23 ENCOUNTER — Ambulatory Visit (HOSPITAL_COMMUNITY)
Admission: EM | Admit: 2023-05-23 | Discharge: 2023-05-23 | Disposition: A | Discharge status: Home / Self Care | Payer: BLUE CROSS/BLUE SHIELD | Attending: Family Medicine | Admitting: Family Medicine

## 2023-05-23 ENCOUNTER — Other Ambulatory Visit: Payer: Self-pay

## 2023-05-23 ENCOUNTER — Encounter (HOSPITAL_COMMUNITY): Payer: Self-pay | Admitting: *Deleted

## 2023-05-23 DIAGNOSIS — J019 Acute sinusitis, unspecified: Secondary | ICD-10-CM

## 2023-05-23 DIAGNOSIS — J4521 Mild intermittent asthma with (acute) exacerbation: Secondary | ICD-10-CM

## 2023-05-23 LAB — POCT RAPID STREP A (OFFICE): Rapid Strep A Screen: NEGATIVE

## 2023-05-23 MED ORDER — PREDNISONE 20 MG PO TABS: 40.0000 mg | ORAL_TABLET | Freq: Every day | ORAL | 0 refills | Status: AC

## 2023-05-23 MED ORDER — CEFDINIR 300 MG PO CAPS: 600.0000 mg | ORAL_CAPSULE | Freq: Every day | ORAL | 0 refills | Status: AC

## 2023-05-23 MED ORDER — ALBUTEROL SULFATE HFA 108 (90 BASE) MCG/ACT IN AERS: 2.0000 | INHALATION_SPRAY | RESPIRATORY_TRACT | 0 refills | Status: AC | PRN

## 2023-05-23 MED ORDER — BENZONATATE 100 MG PO CAPS: 100.0000 mg | ORAL_CAPSULE | Freq: Three times a day (TID) | ORAL | 0 refills | Status: AC | PRN

## 2023-05-23 NOTE — ED Triage Notes (Signed)
Pt reports congestion,facial pressure,runny nose and her voice is hoarse . Sx's have developed over several days. Pt reports having a hard time talking due to hoarseness .

## 2023-05-23 NOTE — ED Triage Notes (Signed)
Pt request a strep test

## 2023-05-23 NOTE — ED Provider Notes (Signed)
[facility]    CSN: [id number] Arrival date & time: 05/23/23  1201      History   Chief Complaint Chief Complaint  Patient presents with   Cough   Nasal Congestion   facial pressure    HPI Ashley Mclaughlin is a 27 y.o. female.    Cough Here for nasal congestion that has been going on for a week.  She then developed some cough and some facial pressure a few days later.  Now she is having a lot more postnasal drainage and cough so hard that she threw up last night.  No fever or chills. She is getting short of breath when she coughs and has a staccato cough  She is also now hoarse.  Last menstrual cycle began today.  No allergies to medications  She has maybe had asthma in the past as she was prescribed an inhaler when she was young  Past Medical History:  Diagnosis Date   H/O chlamydia infection     There are no problems to display for this patient.   Past Surgical History:  Procedure Laterality Date   INDUCED ABORTION  2011    OB History     Gravida  1   Para      Term      Preterm      AB  1   Living         SAB      IAB      Ectopic      Multiple      Live Births               Home Medications    Prior to Admission medications   Medication Sig Start Date End Date Taking? Authorizing Provider  albuterol (VENTOLIN HFA) 108 (90 Base) MCG/ACT inhaler Inhale 2 puffs into the lungs every 4 (four) hours as needed for wheezing or shortness of breath. 05/23/23  Yes Shiniqua Groseclose, Janace Aris, MD  benzonatate (TESSALON) 100 MG capsule Take 1 capsule (100 mg total) by mouth 3 (three) times daily as needed for cough. 05/23/23  Yes Zenia Resides, MD  cefdinir (OMNICEF) 300 MG capsule Take 2 capsules (600 mg total) by mouth daily for 7 days. 05/23/23 05/30/23 Yes Skylor Schnapp, Janace Aris, MD  predniSONE (DELTASONE) 20 MG tablet Take 2 tablets (40 mg total) by mouth daily with breakfast for 5 days. 05/23/23 05/28/23 Yes Zenia Resides, MD   fluticasone (FLONASE) 50 MCG/ACT nasal spray Place 2 sprays into both nostrils at bedtime. Patient not taking: Reported on 12/01/2015 12/06/14 12/01/15  Ozella Rocks, MD  ipratropium (ATROVENT) 0.06 % nasal spray Place 2 sprays into both nostrils 4 (four) times daily. Patient not taking: Reported on 12/01/2015 12/06/14 12/01/15  Ozella Rocks, MD    Family History Family History  Problem Relation Age of Onset   Hypertension Maternal Grandfather    Anemia Mother     Social History Social History   Tobacco Use   Smoking status: Every Day    Types: Cigars   Smokeless tobacco: Never  Vaping Use   Vaping status: Never Used  Substance Use Topics   Alcohol use: No   Drug use: No     Allergies   Patient has no known allergies.   Review of Systems Review of Systems  Respiratory:  Positive for cough.      Physical Exam Triage Vital Signs ED Triage Vitals  Encounter Vitals Group     BP  05/23/23 1334 133/88     Systolic BP Percentile --      Diastolic BP Percentile --      Pulse Rate 05/23/23 1334 88     Resp 05/23/23 1334 20     Temp 05/23/23 1334 98.1 F (36.7 C)     Temp src --      SpO2 05/23/23 1334 96 %     Weight --      Height --      Head Circumference --      Peak Flow --      Pain Score 05/23/23 1332 9     Pain Loc --      Pain Education --      Exclude from Growth Chart --    No data found.  Updated Vital Signs BP 133/88   Pulse 88   Temp 98.1 F (36.7 C)   Resp 20   LMP 05/23/2023   SpO2 96%   Visual Acuity Right Eye Distance:   Left Eye Distance:   Bilateral Distance:    Right Eye Near:   Left Eye Near:    Bilateral Near:     Physical Exam Vitals reviewed.  Constitutional:      General: She is not in acute distress.    Appearance: She is not ill-appearing, toxic-appearing or diaphoretic.  HENT:     Right Ear: Tympanic membrane and ear canal normal.     Left Ear: Tympanic membrane and ear canal normal.     Nose: Congestion  present.     Mouth/Throat:     Mouth: Mucous membranes are moist.     Comments: There is some mild erythema the tonsillar pillars, but no other erythema in the oropharynx.  There is clear mucus draining Eyes:     Extraocular Movements: Extraocular movements intact.     Conjunctiva/sclera: Conjunctivae normal.     Pupils: Pupils are equal, round, and reactive to light.  Cardiovascular:     Rate and Rhythm: Normal rate and regular rhythm.     Heart sounds: No murmur heard. Pulmonary:     Effort: No respiratory distress.     Breath sounds: No stridor. No wheezing, rhonchi or rales.  Musculoskeletal:     Cervical back: Neck supple.  Lymphadenopathy:     Cervical: No cervical adenopathy.  Skin:    Capillary Refill: Capillary refill takes less than 2 seconds.     Coloration: Skin is not jaundiced or pale.  Neurological:     General: No focal deficit present.     Mental Status: She is alert and oriented to person, place, and time.  Psychiatric:        Behavior: Behavior normal.      UC Treatments / Results  Labs (all labs ordered are listed, but only abnormal results are displayed) Labs Reviewed  POCT RAPID STREP A (OFFICE)    EKG   Radiology No results found.  Procedures Procedures (including critical care time)  Medications Ordered in UC Medications - No data to display  Initial Impression / Assessment and Plan / UC Course  I have reviewed the triage vital signs and the nursing notes.  Pertinent labs & imaging results that were available during my care of the patient were reviewed by me and considered in my medical decision making (see chart for details).       Truman Hayward is sent in to treat acute sinusitis with her double sickening pattern.  Also albuterol and prednisone is sent in  to treat mild asthma exacerbation.  Final Clinical Impressions(s) / UC Diagnoses   Final diagnoses:  Acute sinusitis, recurrence not specified, unspecified location  Mild  intermittent asthma with acute exacerbation     Discharge Instructions      Albuterol inhaler--do 2 puffs every 4 hours as needed for shortness of breath or wheezing  Take prednisone 20 mg--2 daily for 5 days  Take cefdinir 300 mg--2 capsules together daily for 7 days\  Take benzonatate 100 mg, 1 tab every 8 hours as needed for cough.       ED Prescriptions     Medication Sig Dispense Auth. Provider   albuterol (VENTOLIN HFA) 108 (90 Base) MCG/ACT inhaler Inhale 2 puffs into the lungs every 4 (four) hours as needed for wheezing or shortness of breath. 1 each Zenia Resides, MD   predniSONE (DELTASONE) 20 MG tablet Take 2 tablets (40 mg total) by mouth daily with breakfast for 5 days. 10 tablet Zenia Resides, MD   cefdinir (OMNICEF) 300 MG capsule Take 2 capsules (600 mg total) by mouth daily for 7 days. 14 capsule Leahmarie Gasiorowski, Janace Aris, MD   benzonatate (TESSALON) 100 MG capsule Take 1 capsule (100 mg total) by mouth 3 (three) times daily as needed for cough. 21 capsule Zenia Resides, MD      PDMP not reviewed this encounter.   Zenia Resides, MD 05/23/23 [id number]

## 2023-05-23 NOTE — Discharge Instructions (Signed)
Albuterol inhaler--do 2 puffs every 4 hours as needed for shortness of breath or wheezing  Take prednisone 20 mg--2 daily for 5 days  Take cefdinir 300 mg--2 capsules together daily for 7 days  Take benzonatate 100 mg, 1 tab every 8 hours as needed for cough.

## 2023-07-28 ENCOUNTER — Encounter (HOSPITAL_COMMUNITY): Payer: Self-pay

## 2023-07-28 ENCOUNTER — Emergency Department (HOSPITAL_COMMUNITY)
Admission: EM | Admit: 2023-07-28 | Discharge: 2023-07-28 | Disposition: A | Discharge status: Home / Self Care | Payer: Medicaid Other | Attending: Emergency Medicine | Admitting: Emergency Medicine

## 2023-07-28 ENCOUNTER — Other Ambulatory Visit: Payer: Self-pay

## 2023-07-28 DIAGNOSIS — W268XXA Contact with other sharp object(s), not elsewhere classified, initial encounter: Secondary | ICD-10-CM | POA: Insufficient documentation

## 2023-07-28 DIAGNOSIS — Y990 Civilian activity done for income or pay: Secondary | ICD-10-CM | POA: Insufficient documentation

## 2023-07-28 DIAGNOSIS — Z5321 Procedure and treatment not carried out due to patient leaving prior to being seen by health care provider: Secondary | ICD-10-CM | POA: Insufficient documentation

## 2023-07-28 DIAGNOSIS — S61213A Laceration without foreign body of left middle finger without damage to nail, initial encounter: Secondary | ICD-10-CM | POA: Insufficient documentation

## 2023-07-28 NOTE — ED Triage Notes (Signed)
C/o left middle finger lac from blade at work.  Bleeding controlled in triage.  Pt reports numbness to finger.  Denies blood thinner usage.

## 2024-05-14 ENCOUNTER — Emergency Department (HOSPITAL_COMMUNITY)
Admission: EM | Admit: 2024-05-14 | Discharge: 2024-05-14 | Discharge status: Against Medical Advice | Payer: Self-pay | Attending: Emergency Medicine | Admitting: Emergency Medicine

## 2024-05-14 ENCOUNTER — Encounter (HOSPITAL_COMMUNITY): Payer: Self-pay

## 2024-05-14 ENCOUNTER — Other Ambulatory Visit: Payer: Self-pay

## 2024-05-14 DIAGNOSIS — Z5321 Procedure and treatment not carried out due to patient leaving prior to being seen by health care provider: Secondary | ICD-10-CM | POA: Insufficient documentation

## 2024-05-14 DIAGNOSIS — X500XXA Overexertion from strenuous movement or load, initial encounter: Secondary | ICD-10-CM | POA: Insufficient documentation

## 2024-05-14 DIAGNOSIS — M545 Low back pain, unspecified: Secondary | ICD-10-CM | POA: Insufficient documentation

## 2024-05-14 NOTE — ED Notes (Signed)
 Pt was seen leaving wait room c/o long wait time and the need to lay down.

## 2024-05-14 NOTE — ED Triage Notes (Signed)
 Patient complains of left lower back pain x 1 hour after lifting something at work. Pain with any change in position
